# Patient Record
Sex: Female | Born: 1990 | Race: White | Hispanic: No | Marital: Single | Smoking: Current every day smoker
Health system: Southern US, Community
[De-identification: ages and names within clinical notes are randomized; demographics above are authoritative.]

## PROBLEM LIST (undated history)

## (undated) ENCOUNTER — Inpatient Hospital Stay (HOSPITAL_COMMUNITY): Payer: Self-pay

## (undated) DIAGNOSIS — Z8659 Personal history of other mental and behavioral disorders: Principal | ICD-10-CM

## (undated) DIAGNOSIS — F32A Depression, unspecified: Secondary | ICD-10-CM

## (undated) DIAGNOSIS — F419 Anxiety disorder, unspecified: Secondary | ICD-10-CM

## (undated) HISTORY — DX: Personal history of other mental and behavioral disorders: Z86.59

## (undated) HISTORY — PX: NO PAST SURGERIES: SHX2092

---
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
## 2012-06-07 ENCOUNTER — Encounter: Payer: Self-pay | Admitting: *Deleted

## 2012-06-30 ENCOUNTER — Other Ambulatory Visit: Payer: Self-pay | Admitting: *Deleted

## 2012-06-30 MED ORDER — AMPHETAMINE-DEXTROAMPHET ER 30 MG PO CP24: 30.0000 mg | ORAL_CAPSULE | ORAL | Status: DC

## 2012-06-30 MED ORDER — CETIRIZINE HCL 10 MG PO TABS: 10.0000 mg | ORAL_TABLET | Freq: Every day | ORAL | Status: DC

## 2012-07-05 ENCOUNTER — Ambulatory Visit: Payer: Self-pay | Admitting: Pediatrics

## 2012-10-06 ENCOUNTER — Ambulatory Visit (INDEPENDENT_AMBULATORY_CARE_PROVIDER_SITE_OTHER): Payer: Medicaid Other | Admitting: Pediatrics

## 2012-10-06 ENCOUNTER — Encounter: Payer: Self-pay | Admitting: Pediatrics

## 2012-10-06 VITALS — HR 100 | Temp 98.8°F | Wt 171.4 lb

## 2012-10-06 DIAGNOSIS — Z8659 Personal history of other mental and behavioral disorders: Secondary | ICD-10-CM

## 2012-10-06 HISTORY — DX: Personal history of other mental and behavioral disorders: Z86.59

## 2012-10-06 NOTE — Progress Notes (Signed)
Patient ID: Andrea Stephenson, female   DOB: [birth year], 22 y.o.   MRN: [id number]  Pt is here for ADHD f/u. Pt is on Adderall XR 30 mg. She has been on this dose for over a year. She was seen 4 m ago and was about to start [employer] classes. She had special forms filled for her ADHD. She ended up not going. She says she got scared and the couldn`t decide if it was what she really wanted to do. Has been doing well. Weight is up 15 lbs. The pt says she has been eating a lot because she snacks at odd hours. Both her sisters are expecting babies and she has had a lot to do and an unusual schedule. Generally the pt has had trouble keeping her weight up till now. She takes Adderall on most days. She skipped it for a few days last month and felt that she was very unfocused and forgetful. She also slept much more.She takes Clonidine for sleep on some nights. She says it is harder to sleep on the days she takes Adderall.  The pt was diagnosed at around 22 y/o. She thinks she was evaluated at a specialist. She has not been re-evaluated since that time. Records are not available prior to 2012. There is no h/o depression or other mood disorders.  ROS:  Apart from the symptoms reviewed above, there are no other symptoms referable to all systems reviewed. AR flaring up in the hot months. Takes Claritin almost daily.  Exam: Pulse 100, temperature 98.8 F (37.1 C), temperature source Temporal, weight 171 lb 6 oz (77.735 kg), last menstrual period 09/09/2012. General: alert, no distress, appropriate affect. Chest: CTA b/l CVS: RRR Neuro: intact.  No results found. No results found for this or any previous visit (from the past 240 hour(s)). No results found for this or any previous visit (from the past 48 hour(s)).  Assessment: ADHD: diagnosed about 15- 16 years ago. Insomnia issues: possibly 2ry to stimulant meds. AR: mild  Plan: Since the pt has been on meds for over 15 years, is not currently working or in school, has  not been re-evaluated, then it is reasonable to to try being off the meds for a while. She may have outgrown ADHD by now. I propose stopping the meds for now entirely and seeing how she does. She will try it for about 2 weeks and let us know how her sleep and focus are. If she still needs meds we may refer for evaluation or try a much lower dose. The pt is somewhat fearful but willing to try it. RTC in 4 m for Wellness check. Call with problems in the meantime. Let us know how she is doing off meds. Try Zyrtec in place of Claritin.

## 2012-12-09 ENCOUNTER — Ambulatory Visit: Payer: Medicaid Other | Admitting: Pediatrics

## 2013-02-09 ENCOUNTER — Ambulatory Visit: Payer: Medicaid Other | Admitting: Family Medicine

## 2014-06-26 ENCOUNTER — Emergency Department (HOSPITAL_COMMUNITY)
Admission: EM | Admit: 2014-06-26 | Discharge: 2014-06-26 | Disposition: A | Discharge status: Home / Self Care | Payer: Self-pay | Attending: Emergency Medicine | Admitting: Emergency Medicine

## 2014-06-26 ENCOUNTER — Encounter (HOSPITAL_COMMUNITY): Payer: Self-pay | Admitting: Emergency Medicine

## 2014-06-26 DIAGNOSIS — Z87891 Personal history of nicotine dependence: Secondary | ICD-10-CM | POA: Insufficient documentation

## 2014-06-26 DIAGNOSIS — N760 Acute vaginitis: Secondary | ICD-10-CM | POA: Insufficient documentation

## 2014-06-26 DIAGNOSIS — Z3202 Encounter for pregnancy test, result negative: Secondary | ICD-10-CM | POA: Insufficient documentation

## 2014-06-26 DIAGNOSIS — Z79899 Other long term (current) drug therapy: Secondary | ICD-10-CM | POA: Insufficient documentation

## 2014-06-26 DIAGNOSIS — N390 Urinary tract infection, site not specified: Secondary | ICD-10-CM | POA: Insufficient documentation

## 2014-06-26 DIAGNOSIS — F909 Attention-deficit hyperactivity disorder, unspecified type: Secondary | ICD-10-CM | POA: Insufficient documentation

## 2014-06-26 DIAGNOSIS — B9689 Other specified bacterial agents as the cause of diseases classified elsewhere: Secondary | ICD-10-CM

## 2014-06-26 LAB — URINALYSIS, ROUTINE W REFLEX MICROSCOPIC
Glucose, UA: 250 mg/dL — AB
Ketones, ur: 15 mg/dL — AB
Nitrite: POSITIVE — AB
Protein, ur: >300 mg/dL — AB
Specific Gravity, Urine: >1.03 — ABNORMAL HIGH (ref 1.005–1.030)
Urobilinogen, UA: >8 mg/dL — ABNORMAL HIGH (ref 0.0–1.0)
pH: 6.5 (ref 5.0–8.0)

## 2014-06-26 LAB — PREGNANCY, URINE: Preg Test, Ur: NEGATIVE

## 2014-06-26 LAB — WET PREP, GENITAL
Trich, Wet Prep: NONE SEEN
Yeast Wet Prep HPF POC: NONE SEEN

## 2014-06-26 LAB — URINE MICROSCOPIC-ADD ON

## 2014-06-26 MED ORDER — AZITHROMYCIN 250 MG PO TABS
1000.0000 mg | ORAL_TABLET | Freq: Once | ORAL | Status: AC
  Administered 2014-06-26: 1000 mg via ORAL
  Filled 2014-06-26: qty 4

## 2014-06-26 MED ORDER — STERILE WATER FOR INJECTION IJ SOLN
INTRAMUSCULAR | Status: AC
  Administered 2014-06-26: 10 mL
  Filled 2014-06-26: qty 10

## 2014-06-26 MED ORDER — IBUPROFEN 400 MG PO TABS
600.0000 mg | ORAL_TABLET | Freq: Once | ORAL | Status: AC
  Administered 2014-06-26: 600 mg via ORAL
  Filled 2014-06-26: qty 2

## 2014-06-26 MED ORDER — CEFTRIAXONE SODIUM 250 MG IJ SOLR
250.0000 mg | Freq: Once | INTRAMUSCULAR | Status: AC
  Administered 2014-06-26: 250 mg via INTRAMUSCULAR
  Filled 2014-06-26: qty 250

## 2014-06-26 MED ORDER — METRONIDAZOLE 500 MG PO TABS
2000.0000 mg | ORAL_TABLET | Freq: Once | ORAL | Status: AC
  Administered 2014-06-26: 2000 mg via ORAL
  Filled 2014-06-26: qty 4

## 2014-06-26 MED ORDER — CEPHALEXIN 500 MG PO CAPS: 500.0000 mg | ORAL_CAPSULE | Freq: Four times a day (QID) | ORAL | Status: DC

## 2014-06-26 MED ORDER — OXYCODONE-ACETAMINOPHEN 5-325 MG PO TABS
2.0000 | ORAL_TABLET | Freq: Once | ORAL | Status: AC
  Administered 2014-06-26: 2 via ORAL
  Filled 2014-06-26: qty 2

## 2014-06-26 NOTE — Discharge Instructions (Signed)
Bacterial Vaginosis Bacterial vaginosis is a vaginal infection that occurs when the normal balance of bacteria in the vagina is disrupted. It results from an overgrowth of certain bacteria. This is the most common vaginal infection in women of childbearing age. Treatment is important to prevent complications, especially in pregnant women, as it can cause a premature delivery. CAUSES  Bacterial vaginosis is caused by an increase in harmful bacteria that are normally present in smaller amounts in the vagina. Several different kinds of bacteria can cause bacterial vaginosis. However, the reason that the condition develops is not fully understood. RISK FACTORS Certain activities or behaviors can put you at an increased risk of developing bacterial vaginosis, including:  Having a new sex partner or multiple sex partners.  Douching.  Using an intrauterine device (IUD) for contraception. Women do not get bacterial vaginosis from toilet seats, bedding, swimming pools, or contact with objects around them. SIGNS AND SYMPTOMS  Some women with bacterial vaginosis have no signs or symptoms. Common symptoms include:  Grey vaginal discharge.  A fishlike odor with discharge, especially after sexual intercourse.  Itching or burning of the vagina and vulva.  Burning or pain with urination. DIAGNOSIS  Your health care provider will take a medical history and examine the vagina for signs of bacterial vaginosis. A sample of vaginal fluid may be taken. Your health care provider will look at this sample under a microscope to check for bacteria and abnormal cells. A vaginal pH test may also be done.  TREATMENT  Bacterial vaginosis may be treated with antibiotic medicines. These may be given in the form of a pill or a vaginal cream. A second round of antibiotics may be prescribed if the condition comes back after treatment.  HOME CARE INSTRUCTIONS   Only take over-the-counter or prescription medicines as  directed by your health care provider.  If antibiotic medicine was prescribed, take it as directed. Make sure you finish it even if you start to feel better.  Do not have sex until treatment is completed.  Tell all sexual partners that you have a vaginal infection. They should see their health care provider and be treated if they have problems, such as a mild rash or itching.  Practice safe sex by using condoms and only having one sex partner. SEEK MEDICAL CARE IF:   Your symptoms are not improving after 3 days of treatment.  You have increased discharge or pain.  You have a fever. MAKE SURE YOU:   Understand these instructions.  Will watch your condition.  Will get help right away if you are not doing well or get worse. FOR MORE INFORMATION  Centers for Disease Control and Prevention, Division of STD Prevention: AppraiserFraud.fi American Sexual Health Association (ASHA): www.ashastd.org  Document Released: 03/24/2005 Document Revised: 01/12/2013 Document Reviewed: 11/03/2012 [facility] Patient Information 2015 [place], [place]. This information is not intended to replace advice given to you by your health care provider. Make sure you discuss any questions you have with your health care provider.  Urinary Tract Infection Urinary tract infections (UTIs) can develop anywhere along your urinary tract. Your urinary tract is your body's drainage system for removing wastes and extra water. Your urinary tract includes two kidneys, two ureters, a bladder, and a urethra. Your kidneys are a pair of bean-shaped organs. Each kidney is about the size of your fist. They are located below your ribs, one on each side of your spine. CAUSES Infections are caused by microbes, which are microscopic organisms, including fungi, viruses,  and bacteria. These organisms are so small that they can only be seen through a microscope. Bacteria are the microbes that most commonly cause UTIs. SYMPTOMS  Symptoms of  UTIs may vary by age and gender of the patient and by the location of the infection. Symptoms in young women typically include a frequent and intense urge to urinate and a painful, burning feeling in the bladder or urethra during urination. Older women and men are more likely to be tired, shaky, and weak and have muscle aches and abdominal pain. A fever may mean the infection is in your kidneys. Other symptoms of a kidney infection include pain in your back or sides below the ribs, nausea, and vomiting. DIAGNOSIS To diagnose a UTI, your caregiver will ask you about your symptoms. Your caregiver also will ask to provide a urine sample. The urine sample will be tested for bacteria and white blood cells. White blood cells are made by your body to help fight infection. TREATMENT  Typically, UTIs can be treated with medication. Because most UTIs are caused by a bacterial infection, they usually can be treated with the use of antibiotics. The choice of antibiotic and length of treatment depend on your symptoms and the type of bacteria causing your infection. HOME CARE INSTRUCTIONS  If you were prescribed antibiotics, take them exactly as your caregiver instructs you. Finish the medication even if you feel better after you have only taken some of the medication.  Drink enough water and fluids to keep your urine clear or pale yellow.  Avoid caffeine, tea, and carbonated beverages. They tend to irritate your bladder.  Empty your bladder often. Avoid holding urine for long periods of time.  Empty your bladder before and after sexual intercourse.  After a bowel movement, women should cleanse from front to back. Use each tissue only once. SEEK MEDICAL CARE IF:   You have back pain.  You develop a fever.  Your symptoms do not begin to resolve within 3 days. SEEK IMMEDIATE MEDICAL CARE IF:   You have severe back pain or lower abdominal pain.  You develop chills.  You have nausea or vomiting.  You  have continued burning or discomfort with urination. MAKE SURE YOU:   Understand these instructions.  Will watch your condition.  Will get help right away if you are not doing well or get worse. Document Released: 01/01/2005 Document Revised: 09/23/2011 Document Reviewed: 05/02/2011 [facility] Patient Information 2015 [place], [place]. This information is not intended to replace advice given to you by your health care provider. Make sure you discuss any questions you have with your health care provider.   Emergency Department Resource Guide 1) Find a Doctor and Pay Out of Pocket Although you won't have to find out who is covered by your insurance plan, it is a good idea to ask around and get recommendations. You will then need to call the office and see if the doctor you have chosen will accept you as a new patient and what types of options they offer for patients who are self-pay. Some doctors offer discounts or will set up payment plans for their patients who do not have insurance, but you will need to ask so you aren't surprised when you get to your appointment.  2) Contact Your Local Health Department Not all health departments have doctors that can see patients for sick visits, but many do, so it is worth a call to see if yours does. If you don't know where your local  health department is, you can check in your phone book. The CDC also has a tool to help you locate your state's health department, and many state websites also have listings of all of their local health departments.  3) Find a Walk-in Clinic If your illness is not likely to be very severe or complicated, you may want to try a walk in clinic. These are popping up all over the country in pharmacies, drugstores, and shopping centers. They're usually staffed by nurse practitioners or physician assistants that have been trained to treat common illnesses and complaints. They're usually fairly quick and inexpensive. However, if you have  serious medical issues or chronic medical problems, these are probably not your best option.  No Primary Care Doctor: - Call Health Connect at  [phone] - they can help you locate a primary care doctor that  accepts your insurance, provides certain services, etc. - Physician Referral Service- [phone]  Chronic Pain Problems: Organization         Address  Phone   Notes  [facility]  [phone] Patients need to be referred by their primary care doctor.   Medication Assistance: Organization         Address  Phone   Notes  [facility] Medication [facility] [address] [place], [place] [address] [phone]) [phone] --Must be a resident of [facility] [place] -- Must have NO insurance coverage whatsoever (no Medicaid/ Medicare, etc.) -- The pt. MUST have a primary care doctor that directs their care regularly and follows them in the community   [facility]ist  [phone]) [phone]   [employer]  [phone]    Agencies that provide inexpensive medical care: Organization         Address  Phone   Notes  [facility]  [phone]) [phone]   [facility]    [phone]   [facility] [address] [place], [place] [address] [phone]) [phone]   [facility] [address], [place] [phone]   Planned Parenthood    [phone]   [facility]    [phone]   [facility]  [address], [place] Phone:  [phone], Fax:  [phone] Hours of Operation:  9 am - 6 pm, M-F.  Also accepts Medicaid/Medicare and self-pay.  [facility]  [address], [place] Phone: [phone]) [phone], Fax: [phone]. Hours of Operation:  8:30 am - 5:30 pm, M-F.  Also accepts Medicaid and self-pay.  [facility] [place] [address], [place] Phone: [phone]   [facility] [address],  [place], [place] [phone], Ext. [phone] Mondays & Thursdays: 7-9 AM.  First 15 patients are seen on a first come, first serve basis.    Medicaid-accepting [facility] Providers:  Organization         Address  Phone   Notes  [facility] [address], [place] [phone] Also accepts self-pay patients.  [facility] [address] [place], [place]  [phone]) [phone]   [facility] [address], [place] [phone]) [phone]   [facility] [address] [place] [address], [place] [phone]) [phone]   Renaye RakersVeita Bland [address], [place]   [phone]) [phone] Only accepts [place] Access [place] patients after they  have their name applied to their card.   Self-Pay (no insurance) in [facility]:  Organization         Address  Phone   Notes  Sickle Cell Patients, [facility] [address] [place], [place] [phone]   [facility] Urgent Care [address] [place], [place] [phone]   [facility] [place]  [address], [place] [phone]   [facility]/Dr. Osei-Bonsu  [address] [place] or [address], [place] [phone] Phone number for both [place] and [place] locations is the same.  Urgent Medical and [facility] [address] [place] [phone]   [facility] [address] [place] or [address] [phone]   [facility] [address], [place] [phone], phone; [phone], fax Sees patients 1st and 3rd Saturday of every month.  Must not qualify for public or private insurance (i.e. Medicaid, Medicare, [place] Health Choice, Veterans' Benefits)  Household income should be no more than 200% of the poverty level The clinic cannot treat you if you are pregnant or think you are pregnant   Sexually transmitted diseases are not treated at the clinic.    Dental Care: Organization         Address  Phone  Notes  [facility] Department of [facility] [address] [place], [place] [phone] Accepts children up to age 60 who are enrolled in [place] or [place] Health Choice; pregnant women with a Medicaid card; and children who have applied for Medicaid or [place] Health Choice, but were declined, whose parents can pay a reduced fee at time of service.  [facility] Department of [facility]  [address], [place] [phone] Accepts children up to age 74 who are enrolled in [place] or [place] Health Choice; pregnant women with a Medicaid card; and children who have applied for Medicaid or [place] Health Choice, but were declined, whose parents can pay a reduced fee at time of service.  [facility]  [address] [place], [place] [phone] Patients are seen by appointment only. Walk-ins are not accepted. [facility] will see patients 84 years of age and older. Monday - Tuesday (8am-5pm) Most Wednesdays (8:30-5pm) $30 per visit, cash only  [facility] Access PRO[facility]  [address], [facility] [phone] Patients are seen by appointment only. Walk-ins are not accepted. [facility] will see patients 31 years of age and older. One Wednesday Evening (Monthly: Volunteer Based).  $30 per visit, cash only  [employer]  [phone] for adults; Children under age 22, call Graduate Pediatric Dentistry at [phone]. Children aged 60-14, please call [phone] to request a pediatric application.  Dental services are provided in all areas of dental care including fillings, crowns and bridges, complete and partial dentures, implants, gum treatment, root canals, and extractions. Preventive care is also provided. Treatment is provided to both adults and children. Patients  are selected via a lottery and there is often a waiting list.   [facility] [address] [place]  [phone] [email or web address]   [facility] [address] [place], [place] [phone], Ext. [phone] Second and Fourth Thursday of each month, opens at 6:30 AM; Clinic ends at 9 AM.  Patients are seen  on a first-come first-served basis, and a limited number are seen during each clinic.   [facility]  [address] [place], [place] [phone]   Eligibility Requirements You must have lived in [place], [place], or [place] counties for at least the last three months.   You cannot be eligible for state or federal sponsored [place], including [employer], [place], or [employer].   You generally cannot be eligible for healthcare insurance through your employer.    How to apply: Eligibility screenings are held every Tuesday and Wednesday afternoon from 1:00 pm until 4:00 pm. You do not need an appointment for the interview!  [facility] [address] [place], [place] [phone]   [facility] Health Department  [phone]   [facility] Health [facility]  [phone]   [facility] Health Department  [phone]    Behavioral Health Resources in the Community: Intensive Outpatient Programs Organization         Address  Phone  Notes  [facility] Services [address] [place], [place] [phone]   [facility] Outpatient [address] [place], [place] [phone]   [facility]: [facility] [address], [place], [place]  [phone]   [facility] [address],  [place], [place] [phone] or [phone]   Substance Abuse Resources Organization         Address  Phone  Notes  Al[facility] Drug Services  [phone]   [facility]  [phone]   [facility] [facility]  [phone]   Floydene Flock  [phone]    Residential & Outpatient Substance Abuse Program  [phone]   Psychological Services Organization         Address  Phone  Notes  [facility] Health  [phone]   [facility] Services  [phone]   [facility] [address] [place] [phone] or [phone]    Mobile Crisis Teams Organization         Address  Phone  Notes  [facility], Mobile Crisis Care Unit  [phone]   [facility]  [address] [place], [place] [phone]   Doristine Locks [address] [place] [phone]    Self-Help/Support Groups Organization         Address  Phone             Notes  [facility] [place] - variety of support groups  [phone]- I7437963 Call for more information  Narcotics Anonymous (NA), Caring [facility] [address], [employer] [place]  2 meetings at this location   Statistician         Address  Phone  Notes  [facility] [address],    [place]  [phone]   [facility]  [address], [place] [address], [place], [place] [phone]   [facility] [address] [place], [place] [phone] Admissions: 8am-3pm M-F  [facility] [address],    [place], [place] [phone]   [facility] [address] [place], [place] [phone]   [facility] [address]  [place], [place] [phone]   [facility] - Intensive Outpatient [address], [place], [place] [phone]   [facility] (Addiction Recovery [facility] [address]  [place], [place] [phone] or [phone]   [facility] ([facility]) [address] [place], [place]  [phone] Accepts Medicaid  Fellowship [address],  [place] [phone] Substance Abuse/Addiction Treatment   [facility] Organization         Address  Phone  Notes  [facility]  [phone]   Angie Fava, PhD [address] [place], [place]   [phone] or [phone]   [facility] Behavioral   [address] [place], [place] [phone]   [facility] [address] [place], [place] [phone] Insurance/Medicaid/sponsorship through [facility] and Families [address]                                    [place], [place] [phone] Therapy/tele-psych/case  [facility] [address] [place], [place] [phone]    Dr. Lolly Mustache  [phone]   [facility]  [employer] [facility] Dept. 1) [address] [place] 2) [address] [place] 3)  [address], [place] [phone]  [phone]   [facility] Child Abuse Hotline 234 531 2765 or [phone] (After Hours)

## 2014-06-26 NOTE — ED Notes (Signed)
Pt reports lower pelvic pain x2 days. Pt reports vaginal discharge. LMP now. Pt denies any abdominal pain,n/v.

## 2014-06-27 NOTE — ED Provider Notes (Signed)
CSN: [id number]     Arrival date & time 06/26/14  1343 History   First MD Initiated Contact with Patient 06/26/14 1823     Chief Complaint  Patient presents with  . Pelvic Pain     (Consider location/radiation/quality/duration/timing/severity/associated sxs/prior Treatment) HPI   23yf with lower abdominal/pelvic pain. Gradual onset about 2 days ago. Associated with whitish vaginal discharge. Currently menstruating. No nausea vomiting. No specific urinary complaints. No diarrhea. No appreciable exacerbating relieving factors.  Past Medical History  Diagnosis Date  . History of ADHD 10/06/2012   History reviewed. No pertinent past surgical history. History reviewed. No pertinent family history. History  Substance Use Topics  . Smoking status: Former Smoker -- 0.50 packs/day    Types: Cigarettes  . Smokeless tobacco: Not on file  . Alcohol Use: No   OB History    No data available     Review of Systems  All systems reviewed and negative, other than as noted in HPI.   Allergies  Review of patient's allergies indicates no known allergies.  Home Medications   Prior to Admission medications   Medication Sig Start Date End Date Taking? Authorizing Provider  Cranberry-Vitamin C-Probiotic (AZO CRANBERRY) 250-30 MG TABS Take 1-2 tablets by mouth daily as needed (FOR URINARY PAIN RELIEF).   Yes Historical Provider, MD  Phenazopyridine HCl (AZO TABS PO) Take 2-3 tablets by mouth daily as needed (FOR URINARY RELIEF).   Yes Historical Provider, MD  amphetamine-dextroamphetamine (ADDERALL XR) 30 MG 24 hr capsule Take 1 capsule (30 mg total) by mouth every morning. Patient not taking: Reported on 06/26/2014 06/30/12   Laurell Josephs, MD  cephALEXin (KEFLEX) 500 MG capsule Take 1 capsule (500 mg total) by mouth 4 (four) times daily. 06/26/14   Raeford Razor, MD  cetirizine (ZYRTEC) 10 MG tablet Take 1 tablet (10 mg total) by mouth daily. Patient not taking: Reported on 06/26/2014 06/30/12    Laurell Josephs, MD   BP 124/75 mmHg  Pulse 94  Temp(Src) 98.7 F (37.1 C) (Oral)  Resp 20  Ht  (1.6 m)  Wt 192 lb 1.6 oz (87.136 kg)  BMI 34.04 kg/m2  SpO2 97%  LMP 06/26/2014 Physical Exam  Constitutional: She appears well-developed and well-nourished. No distress.  HENT:  Head: Normocephalic and atraumatic.  Eyes: Conjunctivae are normal. Right eye exhibits no discharge. Left eye exhibits no discharge.  Neck: Neck supple.  Cardiovascular: Normal rate, regular rhythm and normal heart sounds.  Exam reveals no gallop and no friction rub.   No murmur heard. Pulmonary/Chest: Effort normal and breath sounds normal. No respiratory distress.  Abdominal: Soft. She exhibits no distension. There is no tenderness.  Genitourinary:  Chaperone present. Normal external female genitalia. Scant whitish vaginal discharge. Cervix normal in appearance. Closed. No cervical motion tenderness, adnexal mass or adnexal tenderness.  Musculoskeletal: She exhibits no edema or tenderness.  Neurological: She is alert.  Skin: Skin is warm and dry.  Psychiatric: She has a normal mood and affect. Her behavior is normal. Thought content normal.  Nursing note and vitals reviewed.   ED Course  Procedures (including critical care time) Labs Review Labs Reviewed  WET PREP, GENITAL - Abnormal; Notable for the following:    Clue Cells Wet Prep HPF POC FEW (*)    WBC, Wet Prep HPF POC FEW (*)    All other components within normal limits  URINALYSIS, ROUTINE W REFLEX MICROSCOPIC - Abnormal; Notable for the following:    Color, Urine ORANGE (*)  APPearance CLOUDY (*)    Specific Gravity, Urine >1.030 (*)    Glucose, UA 250 (*)    Hgb urine dipstick LARGE (*)    Bilirubin Urine MODERATE (*)    Ketones, ur 15 (*)    Protein, ur >300 (*)    Urobilinogen, UA >8.0 (*)    Nitrite POSITIVE (*)    Leukocytes, UA MODERATE (*)    All other components within normal limits  URINE MICROSCOPIC-ADD ON - Abnormal;  Notable for the following:    Squamous Epithelial / LPF MANY (*)    Bacteria, UA FEW (*)    All other components within normal limits  URINE CULTURE  PREGNANCY, URINE  GC/CHLAMYDIA PROBE AMP ([place])    Imaging Review No results found.   EKG Interpretation None      MDM   Final diagnoses:  UTI (lower urinary tract infection)  Bacterial vaginosis        Raeford RazorStephen Jailynne Opperman, MD 06/30/14 1946

## 2014-06-28 LAB — URINE CULTURE: Colony Count: >=100000

## 2014-06-28 LAB — GC/CHLAMYDIA PROBE AMP (~~LOC~~) NOT AT ARMC
Chlamydia: NEGATIVE
Neisseria Gonorrhea: NEGATIVE

## 2014-06-29 ENCOUNTER — Telehealth (HOSPITAL_BASED_OUTPATIENT_CLINIC_OR_DEPARTMENT_OTHER): Payer: Self-pay | Admitting: Emergency Medicine

## 2014-06-29 NOTE — Telephone Encounter (Signed)
Post ED Visit - Positive Culture Follow-up  Culture report reviewed by antimicrobial stewardship pharmacist: []  Wes Dulaney, Pharm.D., BCPS [x]  Celedonio MiyamotoJeremy Frens, Pharm.D., BCPS []  Georgina PillionElizabeth Martin, Pharm.D., BCPS []  PleasurevilleMinh Pham, [address]Ph[address]D., BCPS, AAHIVP []  Estella HuskMichelle Turner, Pharm.D., BCPS, AAHIVP []  Elder CyphersLorie Poole, [address]Pharm[address]D., BCPS  Positive urine culture Group A strep Treated with cephalexin, organism sensitive to the same and no further patient follow-up is required at this time.  Berle MullMiller, Teion Ballin 06/29/2014, 9:27 AM

## 2014-08-20 ENCOUNTER — Other Ambulatory Visit (HOSPITAL_COMMUNITY): Payer: Self-pay

## 2014-08-20 ENCOUNTER — Encounter (HOSPITAL_COMMUNITY): Payer: Self-pay | Admitting: Emergency Medicine

## 2014-08-20 ENCOUNTER — Emergency Department (HOSPITAL_COMMUNITY)
Admission: EM | Admit: 2014-08-20 | Discharge: 2014-08-20 | Disposition: A | Discharge status: Home / Self Care | Payer: Self-pay | Attending: Emergency Medicine | Admitting: Emergency Medicine

## 2014-08-20 ENCOUNTER — Ambulatory Visit (HOSPITAL_COMMUNITY)
Admission: RE | Admit: 2014-08-20 | Discharge: 2014-08-20 | Disposition: A | Discharge status: Home / Self Care | Payer: Self-pay | Source: Ambulatory Visit | Attending: Emergency Medicine | Admitting: Emergency Medicine

## 2014-08-20 ENCOUNTER — Other Ambulatory Visit (HOSPITAL_COMMUNITY): Payer: Self-pay | Admitting: Emergency Medicine

## 2014-08-20 DIAGNOSIS — Z3A09 9 weeks gestation of pregnancy: Secondary | ICD-10-CM | POA: Insufficient documentation

## 2014-08-20 DIAGNOSIS — Z87891 Personal history of nicotine dependence: Secondary | ICD-10-CM | POA: Insufficient documentation

## 2014-08-20 DIAGNOSIS — O26891 Other specified pregnancy related conditions, first trimester: Secondary | ICD-10-CM | POA: Insufficient documentation

## 2014-08-20 DIAGNOSIS — Y9289 Other specified places as the place of occurrence of the external cause: Secondary | ICD-10-CM | POA: Insufficient documentation

## 2014-08-20 DIAGNOSIS — O99341 Other mental disorders complicating pregnancy, first trimester: Secondary | ICD-10-CM | POA: Insufficient documentation

## 2014-08-20 DIAGNOSIS — Z792 Long term (current) use of antibiotics: Secondary | ICD-10-CM | POA: Insufficient documentation

## 2014-08-20 DIAGNOSIS — Z3A01 Less than 8 weeks gestation of pregnancy: Secondary | ICD-10-CM | POA: Insufficient documentation

## 2014-08-20 DIAGNOSIS — R52 Pain, unspecified: Secondary | ICD-10-CM

## 2014-08-20 DIAGNOSIS — O9A211 Injury, poisoning and certain other consequences of external causes complicating pregnancy, first trimester: Secondary | ICD-10-CM | POA: Insufficient documentation

## 2014-08-20 DIAGNOSIS — Y998 Other external cause status: Secondary | ICD-10-CM | POA: Insufficient documentation

## 2014-08-20 DIAGNOSIS — Z3490 Encounter for supervision of normal pregnancy, unspecified, unspecified trimester: Secondary | ICD-10-CM

## 2014-08-20 DIAGNOSIS — F909 Attention-deficit hyperactivity disorder, unspecified type: Secondary | ICD-10-CM | POA: Insufficient documentation

## 2014-08-20 DIAGNOSIS — R102 Pelvic and perineal pain: Secondary | ICD-10-CM | POA: Insufficient documentation

## 2014-08-20 DIAGNOSIS — Y9389 Activity, other specified: Secondary | ICD-10-CM | POA: Insufficient documentation

## 2014-08-20 DIAGNOSIS — Z79899 Other long term (current) drug therapy: Secondary | ICD-10-CM | POA: Insufficient documentation

## 2014-08-20 DIAGNOSIS — S301XXA Contusion of abdominal wall, initial encounter: Secondary | ICD-10-CM | POA: Insufficient documentation

## 2014-08-20 LAB — URINALYSIS, ROUTINE W REFLEX MICROSCOPIC
Bilirubin Urine: NEGATIVE
Glucose, UA: NEGATIVE mg/dL
Hgb urine dipstick: NEGATIVE
KETONES UR: NEGATIVE mg/dL
Leukocytes, UA: NEGATIVE
NITRITE: NEGATIVE
PH: 6 (ref 5.0–8.0)
Protein, ur: NEGATIVE mg/dL
Specific Gravity, Urine: 1.02 (ref 1.005–1.030)
Urobilinogen, UA: 0.2 mg/dL (ref 0.0–1.0)

## 2014-08-20 MED ORDER — ACETAMINOPHEN 325 MG PO TABS
650.0000 mg | ORAL_TABLET | Freq: Once | ORAL | Status: AC
  Administered 2014-08-20: 650 mg via ORAL
  Filled 2014-08-20: qty 2
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
## 2014-08-20 NOTE — ED Notes (Signed)
Discharge instructions given, pt demonstrated teach back and verbal understanding. No concerns voiced.  

## 2014-08-20 NOTE — Discharge Instructions (Signed)
You can take acetaminophen 650 mg every 6 hrs for pain if needed. Ice packs to the sore area. Return later this morning for an 11 am ultrasound to look at your pregnancy. Come 15 minutes early and register as an outpatient in ED registration. You will be given the results of your ultrasound by the ED doctor and given any new instructions at that time. Keep your prenatal appointment with Dr Emelda FearFerguson on the 18th, unless told to see him sooner. Return to the ED if you get vaginal bleeding.    Contusion A contusion is a deep bruise. Contusions happen when an injury causes bleeding under the skin. Signs of bruising include pain, puffiness (swelling), and discolored skin. The contusion may turn blue, purple, or yellow. HOME CARE   Put ice on the injured area.  Put ice in a plastic bag.  Place a towel between your skin and the bag.  Leave the ice on for 15-20 minutes, 03-04 times a day.  Only take medicine as told by your doctor.  Rest the injured area.  If possible, raise (elevate) the injured area to lessen puffiness. GET HELP RIGHT AWAY IF:   You have more bruising or puffiness.  You have pain that is getting worse.  Your puffiness or pain is not helped by medicine. MAKE SURE YOU:   Understand these instructions.  Will watch your condition.  Will get help right away if you are not doing well or get worse. Document Released: 09/10/2007 Document Revised: 06/16/2011 Document Reviewed: 01/27/2011 [facility] Patient Information 2015 [place], [place]. This information is not intended to replace advice given to you by your health care provider. Make sure you discuss any questions you have with your health care provider.     First Trimester of Pregnancy The first trimester of pregnancy is from week 1 until the end of week 12 (months 1 through 3). During this time, your baby will begin to develop inside you. At 6-8 weeks, the eyes and face are formed, and the heartbeat can be seen on  ultrasound. At the end of 12 weeks, all the baby's organs are formed. Prenatal care is all the medical care you receive before the birth of your baby. Make sure you get good prenatal care and follow all of your doctor's instructions. HOME CARE  Medicines  Take medicine only as told by your doctor. Some medicines are safe and some are not during pregnancy.  Take your prenatal vitamins as told by your doctor.  Take medicine that helps you poop (stool softener) as needed if your doctor says it is okay. Diet  Eat regular, healthy meals.  Your doctor will tell you the amount of weight gain that is right for you.  Avoid raw meat and uncooked cheese.  If you feel sick to your stomach (nauseous) or throw up (vomit):  Eat 4 or 5 small meals a day instead of 3 large meals.  Try eating a few soda crackers.  Drink liquids between meals instead of during meals.  If you have a hard time pooping (constipation):  Eat high-fiber foods like fresh vegetables, fruit, and whole grains.  Drink enough fluids to keep your pee (urine) clear or pale yellow. Activity and Exercise  Exercise only as told by your doctor. Stop exercising if you have cramps or pain in your lower belly (abdomen) or low back.  Try to avoid standing for long periods of time. Move your legs often if you must stand in one place for a long  time.  Avoid heavy lifting.  Wear low-heeled shoes. Sit and stand up straight.  You can have sex unless your doctor tells you not to. Relief of Pain or Discomfort  Wear a good support bra if your breasts are sore.  Take warm water baths (sitz baths) to soothe pain or discomfort caused by hemorrhoids. Use hemorrhoid cream if your doctor says it is okay.  Rest with your legs raised if you have leg cramps or low back pain.  Wear support hose if you have puffy, bulging veins (varicose veins) in your legs. Raise (elevate) your feet for 15 minutes, 3-4 times a day. Limit salt in your  diet. Prenatal Care  Schedule your prenatal visits by the twelfth week of pregnancy.  Write down your questions. Take them to your prenatal visits.  Keep all your prenatal visits as told by your doctor. Safety  Wear your seat belt at all times when driving.  Make a list of emergency phone numbers. The list should include numbers for family, friends, the hospital, and police and fire departments. General Tips  Ask your doctor for a referral to a local prenatal class. Begin classes no later than at the start of month 6 of your pregnancy.  Ask for help if you need counseling or help with nutrition. Your doctor can give you advice or tell you where to go for help.  Do not use hot tubs, steam rooms, or saunas.  Do not douche or use tampons or scented sanitary pads.  Do not cross your legs for long periods of time.  Avoid litter boxes and soil used by cats.  Avoid all smoking, herbs, and alcohol. Avoid drugs not approved by your doctor.  Visit your dentist. At home, brush your teeth with a soft toothbrush. Be gentle when you floss. GET HELP IF:  You are dizzy.  You have mild cramps or pressure in your lower belly.  You have a nagging pain in your belly area.  You continue to feel sick to your stomach, throw up, or have watery poop (diarrhea).  You have a bad smelling fluid coming from your vagina.  You have pain with peeing (urination).  You have increased puffiness (swelling) in your face, hands, legs, or ankles. GET HELP RIGHT AWAY IF:   You have a fever.  You are leaking fluid from your vagina.  You have spotting or bleeding from your vagina.  You have very bad belly cramping or pain.  You gain or lose weight rapidly.  You throw up blood. It may look like coffee grounds.  You are around people who have [place] measles, fifth disease, or chickenpox.  You have a very bad headache.  You have shortness of breath.  You have any kind of trauma, such as from a  fall or a car accident. Document Released: 09/10/2007 Document Revised: 08/08/2013 Document Reviewed: 02/01/2013 [facility] Patient Information 2015 [place]unctionExitCare, [place]LL[place]. This information is not intended to replace advice given to you by your health care provider. Make sure you discuss any questions you have with your health care provider.

## 2014-08-20 NOTE — ED Notes (Signed)
Assessed FHT very low in abdomen at left side of pubic/pelvis area. Questionable if tones were actual FHT. Registered 149 on doppler as compared to 79 with mothers heart rate on monitor for comparison.
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
## 2014-08-20 NOTE — ED Notes (Signed)
Pt states she had confirmation pregnancy test last week [facility]. She is [redacted] weeks pregnant.

## 2014-08-21 ENCOUNTER — Other Ambulatory Visit: Payer: Self-pay | Admitting: Obstetrics & Gynecology

## 2014-08-21 DIAGNOSIS — O3680X1 Pregnancy with inconclusive fetal viability, fetus 1: Secondary | ICD-10-CM

## 2014-08-23 ENCOUNTER — Ambulatory Visit (INDEPENDENT_AMBULATORY_CARE_PROVIDER_SITE_OTHER): Payer: Medicaid Other

## 2014-08-23 DIAGNOSIS — O3680X1 Pregnancy with inconclusive fetal viability, fetus 1: Secondary | ICD-10-CM

## 2014-08-23 NOTE — Progress Notes (Signed)
US 3046w4d single IUP w/ys, pos fht 142bpm,normal ov's bilat, 2.0 x .5 x 2.4 cm subchorionic hemorrhage

## 2014-08-30 ENCOUNTER — Other Ambulatory Visit (HOSPITAL_COMMUNITY)
Admission: RE | Admit: 2014-08-30 | Discharge: 2014-08-30 | Disposition: A | Discharge status: Home / Self Care | Payer: Self-pay | Source: Ambulatory Visit | Attending: Advanced Practice Midwife | Admitting: Advanced Practice Midwife

## 2014-08-30 ENCOUNTER — Ambulatory Visit (INDEPENDENT_AMBULATORY_CARE_PROVIDER_SITE_OTHER): Payer: Medicaid Other | Admitting: Advanced Practice Midwife

## 2014-08-30 VITALS — BP 120/76 | HR 80 | Wt 183.0 lb

## 2014-08-30 DIAGNOSIS — Z331 Pregnant state, incidental: Secondary | ICD-10-CM

## 2014-08-30 DIAGNOSIS — Z720 Tobacco use: Secondary | ICD-10-CM

## 2014-08-30 DIAGNOSIS — Z3401 Encounter for supervision of normal first pregnancy, first trimester: Secondary | ICD-10-CM

## 2014-08-30 DIAGNOSIS — Z369 Encounter for antenatal screening, unspecified: Secondary | ICD-10-CM

## 2014-08-30 DIAGNOSIS — Z1389 Encounter for screening for other disorder: Secondary | ICD-10-CM

## 2014-08-30 DIAGNOSIS — Z3682 Encounter for antenatal screening for nuchal translucency: Secondary | ICD-10-CM

## 2014-08-30 DIAGNOSIS — Z01419 Encounter for gynecological examination (general) (routine) without abnormal findings: Secondary | ICD-10-CM | POA: Insufficient documentation

## 2014-08-30 DIAGNOSIS — Z113 Encounter for screening for infections with a predominantly sexual mode of transmission: Secondary | ICD-10-CM | POA: Insufficient documentation

## 2014-08-30 DIAGNOSIS — Z3400 Encounter for supervision of normal first pregnancy, unspecified trimester: Secondary | ICD-10-CM | POA: Insufficient documentation

## 2014-08-30 LAB — POCT URINALYSIS DIPSTICK
Glucose, UA: NEGATIVE
Ketones, UA: NEGATIVE
Leukocytes, UA: NEGATIVE
NITRITE UA: NEGATIVE
Protein, UA: NEGATIVE
RBC UA: NEGATIVE

## 2014-08-30 NOTE — Progress Notes (Signed)
  Subjective:    Andrea Stephenson is a G1P0 1370w4d being seen today for her first obstetrical visit.  Her obstetrical history is significant for smoker., ~ 1/2 ppd.  Pregnancy history fully reviewed.  Patient reports no complaints.  Filed Vitals:   08/30/14 1515  BP: 120/76  Pulse: 80  Weight: 183 lb (83.008 kg)    HISTORY: OB History  Gravida Para Term Preterm AB SAB TAB Ectopic Multiple Living  1             # Outcome Date GA Lbr Len/2nd Weight Sex Delivery Anes PTL Lv  1 Current              Past Medical History  Diagnosis Date  . History of ADHD 10/06/2012   No past surgical history on file. No family history on file.   Exam       Pelvic Exam:    Perineum: Normal Perineum   Vulva: normal   Vagina:  normal mucosa, normal discharge, no palpable nodules   Uterus Normal, Gravid, FH: 8     Cervix: normal   Adnexa: Not palpable   Urinary:  urethral meatus normal    System: Breast:  normal appearance, no masses or tenderness   Skin: normal coloration and turgor, no rashes    Neurologic: oriented, normal, normal mood   Extremities: normal strength, tone, and muscle mass   HEENT PERRLA   Mouth/Teeth mucous membranes moist, normal dentition   Neck supple and no masses   Cardiovascular: regular rate and rhythm   Respiratory:  appears well, vitals normal, no respiratory distress, acyanotic   Abdomen: soft, non-tender;  FHR: 160us          Assessment:    Pregnancy: G1P0 Patient Active Problem List   Diagnosis Date Noted  . Supervision of normal first pregnancy 08/30/2014  . History of ADHD 10/06/2012        Plan:     Initial labs drawn. Continue prenatal vitamins  Problem list reviewed and updated  Strategies to decrease/stop smoking discussed, including patch/gum/e cigs Reviewed n/v relief measures and warning s/s to report  Reviewed recommended weight gain based on pre-gravid BMI  Encouraged well-balanced diet Genetic Screening discussed Integrated  Screen: requested.  Ultrasound discussed; fetal survey: requested.  Follow up in 4 weeks for NT/ITLROB.  CRESENZO-DISHMAN,Andrea Stephenson 08/30/2014

## 2014-08-30 NOTE — Progress Notes (Signed)
Pt given CCNC form and lab consents to read over and sign. Pt denies any problems or concerns at this time.  

## 2014-08-30 NOTE — Patient Instructions (Addendum)
Pregnancy and Smoking Smoking during pregnancy is unhealthy for you and your developing baby. The addictive drug nicotine, carbon monoxide, and many other poisons are inhaled from a cigarette and carried through your bloodstream to your baby. Cigarette smoke contains more than 2,500 chemicals. Both nicotine and carbon monoxide play a role in causing health problems in pregnancy. Smoking during pregnancy increases the risk of:  Birth defects in your baby, including heart defects.  Miscarriage and stillbirth.  Birth before 37 completed weeks of pregnancy (premature birth).  Pregnancy outside of the uterus (tubal pregnancy).  Attachment of the placenta over the opening of the uterus (placenta previa).  Detachment of the placenta before the baby's birth (placental abruption).  Breaking of the bag of waters before labor begins (premature rupture of membranes). HOW DOES SMOKING DURING PREGNANCY AFFECT MY BABY? Before Birth Smoking during pregnancy:  Decreases blood flow and oxygen to your baby.  Increases the heart rate of your baby.  Slows your baby's growth in the uterus (intrauterine growth retardation). After Birth Babies born to women who smoke during pregnancy are more likely to have a low birth weight. They are also at risk for:  Serious health problems, chronic or lifelong disabilities (cerebral palsy, mental retardation, learning problems), and death.  Sudden infant death syndrome (SIDS).  Lung and breathing problems. WHAT RESOURCES ARE AVAILABLE TO HELP ME STOP SMOKING?  Ask your health care provider for help to stop smoking. The following resources are available:  Counseling.  Psychological treatment.  Acupuncture.  Family intervention.  Hypnosis.  Nicotine supplements have not been studied enough to know if they are safe to use during pregnancy. They should only be considered when all other methods fail, and if used under the close supervision of your health care  provider.  Telephone QUIT lines. The national smoking cessation telephone hotline number is 1-800-QUIT NOW. FOR MORE INFORMATION  American Cancer Society: www.cancer.org  American Heart Association: www.heart.org  National Cancer Institute: www.cancer.gov  March of Dimes: www.marchofdimes.org Document Released: 08/05/2004 Document Revised: 03/29/2013 Document Reviewed: 02/21/2013 [facility] Patient Information 2015 [place], [place]. This information is not intended to replace advice given to you by your health care provider. Make sure you discuss any questions you have with your health care provider. First Trimester of Pregnancy The first trimester of pregnancy is from week 1 until the end of week 12 (months 1 through 3). A week after a sperm fertilizes an egg, the egg will implant on the wall of the uterus. This embryo will begin to develop into a baby. Genes from you and your partner are forming the baby. The female genes determine whether the baby is a boy or a girl. At 6-8 weeks, the eyes and face are formed, and the heartbeat can be seen on ultrasound. At the end of 12 weeks, all the baby's organs are formed.  Now that you are pregnant, you will want to do everything you can to have a healthy baby. Two of the most important things are to get good prenatal care and to follow your health care provider's instructions. Prenatal care is all the medical care you receive before the baby's birth. This care will help prevent, find, and treat any problems during the pregnancy and childbirth. BODY CHANGES Your body goes through many changes during pregnancy. The changes vary from woman to woman.   You may gain or lose a couple of pounds at first.  You may feel sick to your stomach (nauseous) and throw up (vomit). If the  vomiting is uncontrollable, call your health care provider.  You may tire easily.  You may develop headaches that can be relieved by medicines approved by your health care  provider.  You may urinate more often. Painful urination may mean you have a bladder infection.  You may develop heartburn as a result of your pregnancy.  You may develop constipation because certain hormones are causing the muscles that push waste through your intestines to slow down.  You may develop hemorrhoids or swollen, bulging veins (varicose veins).  Your breasts may begin to grow larger and become tender. Your nipples may stick out more, and the tissue that surrounds them (areola) may become darker.  Your gums may bleed and may be sensitive to brushing and flossing.  Dark spots or blotches (chloasma, mask of pregnancy) may develop on your face. This will likely fade after the baby is born.  Your menstrual periods will stop.  You may have a loss of appetite.  You may develop cravings for certain kinds of food.  You may have changes in your emotions from day to day, such as being excited to be pregnant or being concerned that something may go wrong with the pregnancy and baby.  You may have more vivid and strange dreams.  You may have changes in your hair. These can include thickening of your hair, rapid growth, and changes in texture. Some women also have hair loss during or after pregnancy, or hair that feels dry or thin. Your hair will most likely return to normal after your baby is born. WHAT TO EXPECT AT YOUR PRENATAL VISITS During a routine prenatal visit:  You will be weighed to make sure you and the baby are growing normally.  Your blood pressure will be taken.  Your abdomen will be measured to track your baby's growth.  The fetal heartbeat will be listened to starting around week 10 or 12 of your pregnancy.  Test results from any previous visits will be discussed. Your health care provider may ask you:  How you are feeling.  If you are feeling the baby move.  If you have had any abnormal symptoms, such as leaking fluid, bleeding, severe headaches, or  abdominal cramping.  If you have any questions. Other tests that may be performed during your first trimester include:  Blood tests to find your blood type and to check for the presence of any previous infections. They will also be used to check for low iron levels (anemia) and Rh antibodies. Later in the pregnancy, blood tests for diabetes will be done along with other tests if problems develop.  Urine tests to check for infections, diabetes, or protein in the urine.  An ultrasound to confirm the proper growth and development of the baby.  An amniocentesis to check for possible genetic problems.  Fetal screens for spina bifida and Down syndrome.  You may need other tests to make sure you and the baby are doing well. HOME CARE INSTRUCTIONS  Medicines  Follow your health care provider's instructions regarding medicine use. Specific medicines may be either safe or unsafe to take during pregnancy.  Take your prenatal vitamins as directed.  If you develop constipation, try taking a stool softener if your health care provider approves. Diet  Eat regular, well-balanced meals. Choose a variety of foods, such as meat or vegetable-based protein, fish, milk and low-fat dairy products, vegetables, fruits, and whole grain breads and cereals. Your health care provider will help you determine the amount of weight  gain that is right for you.  Avoid raw meat and uncooked cheese. These carry germs that can cause birth defects in the baby.  Eating four or five small meals rather than three large meals a day may help relieve nausea and vomiting. If you start to feel nauseous, eating a few soda crackers can be helpful. Drinking liquids between meals instead of during meals also seems to help nausea and vomiting.  If you develop constipation, eat more high-fiber foods, such as fresh vegetables or fruit and whole grains. Drink enough fluids to keep your urine clear or pale yellow. Activity and  Exercise  Exercise only as directed by your health care provider. Exercising will help you:  Control your weight.  Stay in shape.  Be prepared for labor and delivery.  Experiencing pain or cramping in the lower abdomen or low back is a good sign that you should stop exercising. Check with your health care provider before continuing normal exercises.  Try to avoid standing for long periods of time. Move your legs often if you must stand in one place for a long time.  Avoid heavy lifting.  Wear low-heeled shoes, and practice good posture.  You may continue to have sex unless your health care provider directs you otherwise. Relief of Pain or Discomfort  Wear a good support bra for breast tenderness.   Take warm sitz baths to soothe any pain or discomfort caused by hemorrhoids. Use hemorrhoid cream if your health care provider approves.   Rest with your legs elevated if you have leg cramps or low back pain.  If you develop varicose veins in your legs, wear support hose. Elevate your feet for 15 minutes, 3-4 times a day. Limit salt in your diet. Prenatal Care  Schedule your prenatal visits by the twelfth week of pregnancy. They are usually scheduled monthly at first, then more often in the last 2 months before delivery.  Write down your questions. Take them to your prenatal visits.  Keep all your prenatal visits as directed by your health care provider. Safety  Wear your seat belt at all times when driving.  Make a list of emergency phone numbers, including numbers for family, friends, the hospital, and police and fire departments. General Tips  Ask your health care provider for a referral to a local prenatal education class. Begin classes no later than at the beginning of month 6 of your pregnancy.  Ask for help if you have counseling or nutritional needs during pregnancy. Your health care provider can offer advice or refer you to specialists for help with various  needs.  Do not use hot tubs, steam rooms, or saunas.  Do not douche or use tampons or scented sanitary pads.  Do not cross your legs for long periods of time.  Avoid cat litter boxes and soil used by cats. These carry germs that can cause birth defects in the baby and possibly loss of the fetus by miscarriage or stillbirth.  Avoid all smoking, herbs, alcohol, and medicines not prescribed by your health care provider. Chemicals in these affect the formation and growth of the baby.  Schedule a dentist appointment. At home, brush your teeth with a soft toothbrush and be gentle when you floss. SEEK MEDICAL CARE IF:   You have dizziness.  You have mild pelvic cramps, pelvic pressure, or nagging pain in the abdominal area.  You have persistent nausea, vomiting, or diarrhea.  You have a bad smelling vaginal discharge.  You have pain with  urination.  You notice increased swelling in your face, hands, legs, or ankles. SEEK IMMEDIATE MEDICAL CARE IF:   You have a fever.  You are leaking fluid from your vagina.  You have spotting or bleeding from your vagina.  You have severe abdominal cramping or pain.  You have rapid weight gain or loss.  You vomit blood or material that looks like coffee grounds.  You are exposed to [place] measles and have never had them.  You are exposed to fifth disease or chickenpox.  You develop a severe headache.  You have shortness of breath.  You have any kind of trauma, such as from a fall or a car accident. Document Released: 03/18/2001 Document Revised: 08/08/2013 Document Reviewed: 02/01/2013 [facility] Patient Information 2015 [place], [place]. This information is not intended to replace advice given to you by your health care provider. Make sure you discuss any questions you have with your health care provider.

## 2014-09-01 LAB — URINE CULTURE

## 2014-09-02 MED ORDER — NITROFURANTOIN MONOHYD MACRO 100 MG PO CAPS: 100.0000 mg | ORAL_CAPSULE | Freq: Two times a day (BID) | ORAL | Status: AC

## 2014-09-02 NOTE — Addendum Note (Signed)
Addended by: Jacklyn ShellRESENZO-DISHMON, Erinn Huskins on: 09/02/2014 09:35 AM   Modules accepted: Orders

## 2014-09-02 NOTE — Progress Notes (Signed)
Ur cx +  Rx macrobid 100mg  BID X7

## 2014-09-05 ENCOUNTER — Telehealth: Payer: Self-pay | Admitting: *Deleted

## 2014-09-05 LAB — CYTOLOGY - PAP

## 2014-09-05 NOTE — Telephone Encounter (Signed)
-----   Message from Jacklyn ShellFrances Cresenzo-Dishmon, CNM sent at 09/02/2014  9:33 AM EDT ----- rx macrobid 500mg  BID x 7

## 2014-09-05 NOTE — Telephone Encounter (Signed)
Pt informed of Positive urine culture results from 08/30/2014. Macrobid e-scribed to [facility]. Pt verbalized understanding.

## 2014-09-06 LAB — CBC
HEMATOCRIT: 37.2 % (ref 34.0–46.6)
HEMOGLOBIN: 12.3 g/dL (ref 11.1–15.9)
MCH: 26.7 pg (ref 26.6–33.0)
MCHC: 33.1 g/dL (ref 31.5–35.7)
MCV: 81 fL (ref 79–97)
Platelets: 200 10*3/uL (ref 150–379)
RBC: 4.6 x10E6/uL (ref 3.77–5.28)
RDW: 14.3 % (ref 12.3–15.4)
WBC: 9 10*3/uL (ref 3.4–10.8)

## 2014-09-06 LAB — PMP SCREEN PROFILE (10S), URINE
Amphetamine Screen, Ur: NEGATIVE ng/mL
Barbiturate Screen, Ur: NEGATIVE ng/mL
Benzodiazepine Screen, Urine: NEGATIVE ng/mL
COCAINE(METAB.) SCREEN, URINE: NEGATIVE ng/mL
CREATININE(CRT), U: 110.4 mg/dL (ref 20.0–300.0)
Cannabinoids Ur Ql Scn: POSITIVE ng/mL
Methadone Scn, Ur: NEGATIVE ng/mL
OPIATE SCRN UR: NEGATIVE ng/mL
Oxycodone+Oxymorphone Ur Ql Scn: NEGATIVE ng/mL
PCP SCRN UR: NEGATIVE ng/mL
Ph of Urine: 7.7 (ref 4.5–8.9)
Propoxyphene, Screen: NEGATIVE ng/mL

## 2014-09-06 LAB — URINALYSIS, ROUTINE W REFLEX MICROSCOPIC
BILIRUBIN UA: NEGATIVE
Glucose, UA: NEGATIVE
KETONES UA: NEGATIVE
Leukocytes, UA: NEGATIVE
NITRITE UA: NEGATIVE
PROTEIN UA: NEGATIVE
RBC, UA: NEGATIVE
SPEC GRAV UA: 1.018 (ref 1.005–1.030)
UUROB: 0.2 mg/dL (ref 0.2–1.0)
pH, UA: 7.5 (ref 5.0–7.5)

## 2014-09-06 LAB — HIV ANTIBODY (ROUTINE TESTING W REFLEX): HIV Screen 4th Generation wRfx: NONREACTIVE

## 2014-09-06 LAB — HEPATITIS B SURFACE ANTIGEN: Hepatitis B Surface Ag: NEGATIVE

## 2014-09-06 LAB — RUBELLA SCREEN: RUBELLA: 7.26 {index} (ref >0.99)

## 2014-09-06 LAB — CYSTIC FIBROSIS MUTATION 97: Interpretation: NOT DETECTED

## 2014-09-06 LAB — ANTIBODY SCREEN: Antibody Screen: NEGATIVE

## 2014-09-06 LAB — RPR: RPR: NONREACTIVE

## 2014-09-06 LAB — ABO/RH: Rh Factor: NEGATIVE

## 2014-09-27 ENCOUNTER — Other Ambulatory Visit: Payer: Self-pay

## 2014-09-27 ENCOUNTER — Encounter: Payer: Self-pay | Admitting: Women's Health

## 2014-10-02 ENCOUNTER — Encounter: Payer: Self-pay | Admitting: Women's Health

## 2014-10-04 ENCOUNTER — Ambulatory Visit (INDEPENDENT_AMBULATORY_CARE_PROVIDER_SITE_OTHER): Payer: Medicaid Other | Admitting: Advanced Practice Midwife

## 2014-10-04 ENCOUNTER — Encounter: Payer: Self-pay | Admitting: Advanced Practice Midwife

## 2014-10-04 ENCOUNTER — Ambulatory Visit (INDEPENDENT_AMBULATORY_CARE_PROVIDER_SITE_OTHER): Payer: Medicaid Other

## 2014-10-04 VITALS — BP 110/70 | HR 76 | Wt 181.0 lb

## 2014-10-04 DIAGNOSIS — Z3401 Encounter for supervision of normal first pregnancy, first trimester: Secondary | ICD-10-CM

## 2014-10-04 DIAGNOSIS — Z3682 Encounter for antenatal screening for nuchal translucency: Secondary | ICD-10-CM

## 2014-10-04 DIAGNOSIS — N390 Urinary tract infection, site not specified: Secondary | ICD-10-CM

## 2014-10-04 DIAGNOSIS — Z1389 Encounter for screening for other disorder: Secondary | ICD-10-CM

## 2014-10-04 DIAGNOSIS — Z36 Encounter for antenatal screening of mother: Secondary | ICD-10-CM | POA: Diagnosis not present

## 2014-10-04 DIAGNOSIS — Z331 Pregnant state, incidental: Secondary | ICD-10-CM

## 2014-10-04 LAB — POCT URINALYSIS DIPSTICK
Blood, UA: NEGATIVE
Glucose, UA: NEGATIVE
KETONES UA: NEGATIVE
Leukocytes, UA: NEGATIVE
Nitrite, UA: NEGATIVE
PROTEIN UA: NEGATIVE

## 2014-10-04 MED ORDER — NITROFURANTOIN MONOHYD MACRO 100 MG PO CAPS: 100.0000 mg | ORAL_CAPSULE | Freq: Two times a day (BID) | ORAL | Status: AC

## 2014-10-04 NOTE — Progress Notes (Signed)
[place] 13+4wks single IUP,pos fht 164bpm,NB present,NT 1.64MM,CRL 78.578mm,normal ov's bilat

## 2014-10-04 NOTE — Progress Notes (Signed)
G1P0 129w4d Estimated Date of Delivery: 04/07/15  Blood pressure 110/70, pulse 76, weight 181 lb (82.101 kg), last menstrual period 06/24/2014.   BP weight and urine results all reviewed and noted.  Please refer to the obstetrical flow sheet for the fundal height and fetal heart rate documentation:  Patient reports good fetal movement, denies any bleeding and no rupture of membranes symptoms or regular contractions. Patient is without complaints. All questions were answered.  Plan:  Continued routine obstetrical care, NT/IT today.  Did not take Macrobid because it was too expensive. Has medicaid now.  Rx resent.   Follow up in 4 weeks for OB appointment, 2nd IT

## 2014-10-06 LAB — MATERNAL SCREEN, INTEGRATED #1
CROWN RUMP LENGTH MAT SCREEN: 78.8 mm
Gest. Age on Collection Date: 13.3 weeks
MATERNAL AGE AT EDD: 23.6 a
NUMBER OF FETUSES: 1
Nuchal Translucency (NT): 1.6 mm
PAPP-A Value: 1263.3 ng/mL
Weight: 181 [lb_av]

## 2014-11-01 ENCOUNTER — Ambulatory Visit (INDEPENDENT_AMBULATORY_CARE_PROVIDER_SITE_OTHER): Payer: Medicaid Other | Admitting: Advanced Practice Midwife

## 2014-11-01 VITALS — BP 102/72 | HR 76 | Wt 185.0 lb

## 2014-11-01 DIAGNOSIS — Z3682 Encounter for antenatal screening for nuchal translucency: Secondary | ICD-10-CM

## 2014-11-01 DIAGNOSIS — Z331 Pregnant state, incidental: Secondary | ICD-10-CM

## 2014-11-01 DIAGNOSIS — Z1389 Encounter for screening for other disorder: Secondary | ICD-10-CM

## 2014-11-01 DIAGNOSIS — N390 Urinary tract infection, site not specified: Secondary | ICD-10-CM

## 2014-11-01 DIAGNOSIS — Z363 Encounter for antenatal screening for malformations: Secondary | ICD-10-CM

## 2014-11-01 DIAGNOSIS — Z3402 Encounter for supervision of normal first pregnancy, second trimester: Secondary | ICD-10-CM

## 2014-11-01 LAB — POCT URINALYSIS DIPSTICK
Blood, UA: NEGATIVE
Glucose, UA: NEGATIVE
Ketones, UA: NEGATIVE
Leukocytes, UA: NEGATIVE
NITRITE UA: NEGATIVE
Protein, UA: NEGATIVE

## 2014-11-01 NOTE — Progress Notes (Signed)
G1P0 [redacted]w[redacted]d Estimated Date of Delivery: 04/07/15  Last menstrual period 06/24/2014.   BP weight and urine results all reviewed and noted.  Please refer to the obstetrical flow sheet for the fundal height and fetal heart rate documentation:  Patient denies any bleeding and no rupture of membranes symptoms or regular contractions. Patient is without complaints. All questions were answered.  Plan:  Continued routine obstetrical care, POC urine culture for ASB.  2nd IT today  Follow up in 2 weeks for OB appointment, anatomy scan

## 2014-11-03 LAB — MATERNAL SCREEN, INTEGRATED #2
AFP MARKER: 18.5 ng/mL
AFP MoM: 0.6
Crown Rump Length: 78.8 mm
DIA MoM: 1.58
DIA Value: 238.2 pg/mL
Estriol, Unconjugated: 1.25 ng/mL
GEST. AGE ON COLLECTION DATE: 13.3 wk
Gestational Age: 17.3 weeks
Maternal Age at EDD: 23.6 years
Nuchal Translucency (NT): 1.6 mm
Nuchal Translucency MoM: 0.87
Number of Fetuses: 1
PAPP-A MoM: 1.19
PAPP-A VALUE: 1263.3 ng/mL
Test Results:: NEGATIVE
WEIGHT: 181 [lb_av]
Weight: 185 [lb_av]
hCG MoM: 1.33
hCG Value: 31.6 IU/mL
uE3 MoM: 1.18

## 2014-11-03 LAB — URINE CULTURE

## 2014-11-15 ENCOUNTER — Ambulatory Visit (INDEPENDENT_AMBULATORY_CARE_PROVIDER_SITE_OTHER): Payer: Medicaid Other | Admitting: Women's Health

## 2014-11-15 ENCOUNTER — Encounter: Payer: Self-pay | Admitting: Women's Health

## 2014-11-15 ENCOUNTER — Ambulatory Visit (INDEPENDENT_AMBULATORY_CARE_PROVIDER_SITE_OTHER): Payer: Medicaid Other

## 2014-11-15 VITALS — BP 104/58 | HR 76 | Wt 186.0 lb

## 2014-11-15 DIAGNOSIS — Z36 Encounter for antenatal screening of mother: Secondary | ICD-10-CM | POA: Diagnosis not present

## 2014-11-15 DIAGNOSIS — Z1389 Encounter for screening for other disorder: Secondary | ICD-10-CM

## 2014-11-15 DIAGNOSIS — Z3402 Encounter for supervision of normal first pregnancy, second trimester: Secondary | ICD-10-CM

## 2014-11-15 DIAGNOSIS — Z331 Pregnant state, incidental: Secondary | ICD-10-CM

## 2014-11-15 DIAGNOSIS — Z363 Encounter for antenatal screening for malformations: Secondary | ICD-10-CM

## 2014-11-15 LAB — POCT URINALYSIS DIPSTICK
Blood, UA: NEGATIVE
Glucose, UA: NEGATIVE
Ketones, UA: NEGATIVE
Leukocytes, UA: NEGATIVE
Nitrite, UA: NEGATIVE
PROTEIN UA: NEGATIVE

## 2014-11-15 NOTE — Progress Notes (Signed)
Low-risk OB appointment G1P0 [redacted]w[redacted]d Estimated Date of Delivery: 04/09/15 BP 104/58 mmHg  Pulse 76  Wt 186 lb (84.369 kg)  LMP 06/24/2014  BP, weight, and urine reviewed.  Refer to obstetrical flow sheet for FH & FHR.  Reports good fm.  Denies regular uc's, lof, vb, or uti s/s. No complaints. Reviewed today's anatomy u/s, limited views of spine/heart otherwise normal, will repeat at 28wks. Discussed ptl s/s, fm. Plan:  Continue routine obstetrical care  F/U in 4wks for OB appointment

## 2014-11-15 NOTE — Progress Notes (Signed)
Anatomy US today at 19+[redacted] weeks GA. Single, active female fetus in a breech presentation.  FHR 151 bpm. Posterior Gr0 placenta. Cervix is closed and measures 3.5 cm. Bilateral ovaries appear normal. Fluid is normal with SVP 3.5 cm. EFW today of 308 g which is consistent with dating. Limited views of spine and heart.  Other anatomy visualized and appears normal.

## 2014-12-13 ENCOUNTER — Encounter: Payer: Self-pay | Admitting: Women's Health

## 2014-12-13 ENCOUNTER — Ambulatory Visit (INDEPENDENT_AMBULATORY_CARE_PROVIDER_SITE_OTHER): Payer: Medicaid Other | Admitting: Women's Health

## 2014-12-13 VITALS — BP 124/64 | HR 64 | Wt 193.0 lb

## 2014-12-13 DIAGNOSIS — Z331 Pregnant state, incidental: Secondary | ICD-10-CM

## 2014-12-13 DIAGNOSIS — Z1389 Encounter for screening for other disorder: Secondary | ICD-10-CM

## 2014-12-13 DIAGNOSIS — N898 Other specified noninflammatory disorders of vagina: Secondary | ICD-10-CM | POA: Diagnosis not present

## 2014-12-13 DIAGNOSIS — Z3402 Encounter for supervision of normal first pregnancy, second trimester: Secondary | ICD-10-CM

## 2014-12-13 DIAGNOSIS — O360121 Maternal care for anti-D [Rh] antibodies, second trimester, fetus 1: Secondary | ICD-10-CM

## 2014-12-13 DIAGNOSIS — O26892 Other specified pregnancy related conditions, second trimester: Secondary | ICD-10-CM

## 2014-12-13 DIAGNOSIS — Z363 Encounter for antenatal screening for malformations: Secondary | ICD-10-CM

## 2014-12-13 LAB — POCT WET PREP (WET MOUNT): Clue Cells Wet Prep Whiff POC: NEGATIVE

## 2014-12-13 NOTE — Progress Notes (Signed)
Low-risk OB appointment G1P0 [redacted]w[redacted]d Estimated Date of Delivery: 04/09/15 BP 124/64 mmHg  Pulse 64  Wt 193 lb (87.544 kg)  LMP 06/24/2014  BP, weight, and urine reviewed.  Refer to obstetrical flow sheet for FH & FHR.  Reports good fm.  Denies regular uc's, lof, vb, or uti s/s. White clumpy d/c, no odor/itching/irritation.  Spec exam: cx visually closed, small amount slightly thickened d/c, no clumps, no odor Wet prep neg Reviewed ptl s/s, fm. Plan:  Continue routine obstetrical care  F/U in 4wks for OB appointment, pn2, and f/u anatomy

## 2014-12-13 NOTE — Patient Instructions (Signed)
You will have your sugar test next visit.  Please do not eat or drink anything after midnight the night before you come, not even water.  You will be here for at least two hours.     Call the office ([phone]) or go to [facility] if:  You begin to have strong, frequent contractions  Your water breaks.  Sometimes it is a big gush of fluid, sometimes it is just a trickle that keeps getting your panties wet or running down your legs  You have vaginal bleeding.  It is normal to have a small amount of spotting if your cervix was checked.   You don't feel your baby moving like normal.  If you don't, get you something to eat and drink and lay down and focus on feeling your baby move.   If your baby is still not moving like normal, you should call the office or go to [facility].  Second Trimester of Pregnancy The second trimester is from week 13 through week 28, months 4 through 6. The second trimester is often a time when you feel your best. Your body has also adjusted to being pregnant, and you begin to feel better physically. Usually, morning sickness has lessened or quit completely, you may have more energy, and you may have an increase in appetite. The second trimester is also a time when the fetus is growing rapidly. At the end of the sixth month, the fetus is about 9 inches long and weighs about 1 pounds. You will likely begin to feel the baby move (quickening) between 18 and 20 weeks of the pregnancy. BODY CHANGES Your body goes through many changes during pregnancy. The changes vary from woman to woman.   Your weight will continue to increase. You will notice your lower abdomen bulging out.  You may begin to get stretch marks on your hips, abdomen, and breasts.  You may develop headaches that can be relieved by medicines approved by your health care provider.  You may urinate more often because the fetus is pressing on your bladder.  You may develop or continue to have  heartburn as a result of your pregnancy.  You may develop constipation because certain hormones are causing the muscles that push waste through your intestines to slow down.  You may develop hemorrhoids or swollen, bulging veins (varicose veins).  You may have back pain because of the weight gain and pregnancy hormones relaxing your joints between the bones in your pelvis and as a result of a shift in weight and the muscles that support your balance.  Your breasts will continue to grow and be tender.  Your gums may bleed and may be sensitive to brushing and flossing.  Dark spots or blotches (chloasma, mask of pregnancy) may develop on your face. This will likely fade after the baby is born.  A dark line from your belly button to the pubic area (linea nigra) may appear. This will likely fade after the baby is born.  You may have changes in your hair. These can include thickening of your hair, rapid growth, and changes in texture. Some women also have hair loss during or after pregnancy, or hair that feels dry or thin. Your hair will most likely return to normal after your baby is born. WHAT TO EXPECT AT YOUR PRENATAL VISITS During a routine prenatal visit:  You will be weighed to make sure you and the fetus are growing normally.  Your blood pressure will be taken.    Your abdomen will be measured to track your baby's growth.  The fetal heartbeat will be listened to.  Any test results from the previous visit will be discussed. Your health care provider may ask you:  How you are feeling.  If you are feeling the baby move.  If you have had any abnormal symptoms, such as leaking fluid, bleeding, severe headaches, or abdominal cramping.  If you have any questions. Other tests that may be performed during your second trimester include:  Blood tests that check for:  Low iron levels (anemia).  Gestational diabetes (between 24 and 28 weeks).  Rh antibodies.  Urine tests to check  for infections, diabetes, or protein in the urine.  An ultrasound to confirm the proper growth and development of the baby.  An amniocentesis to check for possible genetic problems.  Fetal screens for spina bifida and Down syndrome. HOME CARE INSTRUCTIONS   Avoid all smoking, herbs, alcohol, and unprescribed drugs. These chemicals affect the formation and growth of the baby.  Follow your health care provider's instructions regarding medicine use. There are medicines that are either safe or unsafe to take during pregnancy.  Exercise only as directed by your health care provider. Experiencing uterine cramps is a good sign to stop exercising.  Continue to eat regular, healthy meals.  Wear a good support bra for breast tenderness.  Do not use hot tubs, steam rooms, or saunas.  Wear your seat belt at all times when driving.  Avoid raw meat, uncooked cheese, cat litter boxes, and soil used by cats. These carry germs that can cause birth defects in the baby.  Take your prenatal vitamins.  Try taking a stool softener (if your health care provider approves) if you develop constipation. Eat more high-fiber foods, such as fresh vegetables or fruit and whole grains. Drink plenty of fluids to keep your urine clear or pale yellow.  Take warm sitz baths to soothe any pain or discomfort caused by hemorrhoids. Use hemorrhoid cream if your health care provider approves.  If you develop varicose veins, wear support hose. Elevate your feet for 15 minutes, 3-4 times a day. Limit salt in your diet.  Avoid heavy lifting, wear low heel shoes, and practice good posture.  Rest with your legs elevated if you have leg cramps or low back pain.  Visit your dentist if you have not gone yet during your pregnancy. Use a soft toothbrush to brush your teeth and be gentle when you floss.  A sexual relationship may be continued unless your health care provider directs you otherwise.  Continue to go to all your  prenatal visits as directed by your health care provider. SEEK MEDICAL CARE IF:   You have dizziness.  You have mild pelvic cramps, pelvic pressure, or nagging pain in the abdominal area.  You have persistent nausea, vomiting, or diarrhea.  You have a bad smelling vaginal discharge.  You have pain with urination. SEEK IMMEDIATE MEDICAL CARE IF:   You have a fever.  You are leaking fluid from your vagina.  You have spotting or bleeding from your vagina.  You have severe abdominal cramping or pain.  You have rapid weight gain or loss.  You have shortness of breath with chest pain.  You notice sudden or extreme swelling of your face, hands, ankles, feet, or legs.  You have not felt your baby move in over an hour.  You have severe headaches that do not go away with medicine.  You have vision changes.   Document Released: 03/18/2001 Document Revised: 03/29/2013 Document Reviewed: 05/25/2012 [employer] Patient Information 2015 [employer]. This information is not intended to replace advice given to you by your health care provider. Make sure you discuss any questions you have with your health care provider.     

## 2015-01-10 ENCOUNTER — Encounter: Payer: Self-pay | Admitting: Women's Health

## 2015-01-10 ENCOUNTER — Ambulatory Visit (INDEPENDENT_AMBULATORY_CARE_PROVIDER_SITE_OTHER): Payer: Medicaid Other

## 2015-01-10 ENCOUNTER — Ambulatory Visit (INDEPENDENT_AMBULATORY_CARE_PROVIDER_SITE_OTHER): Payer: Medicaid Other | Admitting: Women's Health

## 2015-01-10 ENCOUNTER — Other Ambulatory Visit: Payer: Self-pay | Admitting: Women's Health

## 2015-01-10 ENCOUNTER — Other Ambulatory Visit: Payer: Medicaid Other

## 2015-01-10 VITALS — BP 120/60 | HR 84 | Wt 196.0 lb

## 2015-01-10 DIAGNOSIS — Z0373 Encounter for suspected fetal anomaly ruled out: Secondary | ICD-10-CM | POA: Diagnosis not present

## 2015-01-10 DIAGNOSIS — Z369 Encounter for antenatal screening, unspecified: Secondary | ICD-10-CM

## 2015-01-10 DIAGNOSIS — Z331 Pregnant state, incidental: Secondary | ICD-10-CM

## 2015-01-10 DIAGNOSIS — Z0489 Encounter for examination and observation for other specified reasons: Secondary | ICD-10-CM

## 2015-01-10 DIAGNOSIS — Z3402 Encounter for supervision of normal first pregnancy, second trimester: Secondary | ICD-10-CM

## 2015-01-10 DIAGNOSIS — Z1389 Encounter for screening for other disorder: Secondary | ICD-10-CM

## 2015-01-10 DIAGNOSIS — IMO0002 Reserved for concepts with insufficient information to code with codable children: Secondary | ICD-10-CM

## 2015-01-10 DIAGNOSIS — Z131 Encounter for screening for diabetes mellitus: Secondary | ICD-10-CM

## 2015-01-10 LAB — POCT URINALYSIS DIPSTICK
Blood, UA: NEGATIVE
KETONES UA: NEGATIVE
LEUKOCYTES UA: NEGATIVE
Nitrite, UA: NEGATIVE
PROTEIN UA: NEGATIVE

## 2015-01-10 NOTE — Progress Notes (Signed)
Low-risk OB appointment G1P0 [redacted]w[redacted]d Estimated Date of Delivery: 04/09/15 BP 120/60 mmHg  Pulse 84  Wt 196 lb (88.905 kg)  LMP 06/24/2014  BP, weight, and urine reviewed.  Refer to obstetrical flow sheet for FH & FHR.  Reports good fm.  Denies regular uc's, lof, vb, or uti s/s. No complaints. Reviewed today's normal u/s for anatomy f/u, ptl s/s, fkc. Recommended Tdap at HD/PCP per CDC guidelines.  Plan:  Continue routine obstetrical care  F/U in 3wks for OB appointment  PN2 today, wants flu shot next visit

## 2015-01-10 NOTE — Patient Instructions (Addendum)
Call the office ([phone]) or go to [facility] if:  You begin to have strong, frequent contractions  Your water breaks.  Sometimes it is a big gush of fluid, sometimes it is just a trickle that keeps getting your panties wet or running down your legs  You have vaginal bleeding.  It is normal to have a small amount of spotting if your cervix was checked.   You don't feel your baby moving like normal.  If you don't, get you something to eat and drink and lay down and focus on feeling your baby move.  You should feel at least 10 movements in 2 hours.  If you don't, you should call the office or go to [facility].    Tdap Vaccine  It is recommended that you get the Tdap vaccine during the third trimester of EACH pregnancy to help protect your baby from getting pertussis (whooping cough)  27-36 weeks is the BEST time to do this so that you can pass the protection on to your baby. During pregnancy is better than after pregnancy, but if you are unable to get it during pregnancy it will be offered at the hospital.   You can get this vaccine at the health department or your family doctor  Everyone who will be around your baby should also be up-to-date on their vaccines. Adults (who are not pregnant) only need 1 dose of Tdap during adulthood.   [place] Pediatricians/Family Doctors:  [facility] [phone]            [facility] [phone]                 [facility] [phone] (usually not accepting new patients unless you have family there already, you are always welcome to call and ask)            [facility] ([address] [place]) [phone]   [place] Pediatricians/Family Doctors:   [facility]: [phone]  [facility]: [phone]    Third Trimester of Pregnancy The third trimester is from week 29 through week 42, months 7 through 9. The third trimester is a time when the  fetus is growing rapidly. At the end of the ninth month, the fetus is about 20 inches in length and weighs 6-10 pounds.  BODY CHANGES Your body goes through many changes during pregnancy. The changes vary from woman to woman.  9. Your weight will continue to increase. You can expect to gain 25-35 pounds (11-16 kg) by the end of the pregnancy. 10. You may begin to get stretch marks on your hips, abdomen, and breasts. 11. You may urinate more often because the fetus is moving lower into your pelvis and pressing on your bladder. 12. You may develop or continue to have heartburn as a result of your pregnancy. 13. You may develop constipation because certain hormones are causing the muscles that push waste through your intestines to slow down. 14. You may develop hemorrhoids or swollen, bulging veins (varicose veins). 15. You may have pelvic pain because of the weight gain and pregnancy hormones relaxing your joints between the bones in your pelvis. Backaches may result from overexertion of the muscles supporting your posture. 16. You may have changes in your hair. These can include thickening of your hair, rapid growth, and changes in texture. Some women also have hair loss during or after pregnancy, or hair that feels dry or thin. Your hair will most likely return to normal after   your baby is born. 17. Your breasts will continue to grow and be tender. A yellow discharge may leak from your breasts called colostrum. 18. Your belly button may stick out. 19. You may feel short of breath because of your expanding uterus. 20. You may notice the fetus "dropping," or moving lower in your abdomen. 21. You may have a bloody mucus discharge. This usually occurs a few days to a week before labor begins. 22. Your cervix becomes thin and soft (effaced) near your due date. WHAT TO EXPECT AT YOUR PRENATAL EXAMS  You will have prenatal exams every 2 weeks until week 36. Then, you will have weekly prenatal exams.  During a routine prenatal visit: 3. You will be weighed to make sure you and the fetus are growing normally. 4. Your blood pressure is taken. 5. Your abdomen will be measured to track your baby's growth. 6. The fetal heartbeat will be listened to. 7. Any test results from the previous visit will be discussed. 8. You may have a cervical check near your due date to see if you have effaced. At around 36 weeks, your caregiver will check your cervix. At the same time, your caregiver will also perform a test on the secretions of the vaginal tissue. This test is to determine if a type of bacteria, Group B streptococcus, is present. Your caregiver will explain this further. Your caregiver may ask you: 2. What your birth plan is. 3. How you are feeling. 4. If you are feeling the baby move. 5. If you have had any abnormal symptoms, such as leaking fluid, bleeding, severe headaches, or abdominal cramping. 6. If you have any questions. Other tests or screenings that may be performed during your third trimester include: 2. Blood tests that check for low iron levels (anemia). 3. Fetal testing to check the health, activity level, and growth of the fetus. Testing is done if you have certain medical conditions or if there are problems during the pregnancy. FALSE LABOR You may feel small, irregular contractions that eventually go away. These are called Braxton Hicks contractions, or false labor. Contractions may last for hours, days, or even weeks before true labor sets in. If contractions come at regular intervals, intensify, or become painful, it is best to be seen by your caregiver.  SIGNS OF LABOR  3. Menstrual-like cramps. 4. Contractions that are 5 minutes apart or less. 5. Contractions that start on the top of the uterus and spread down to the lower abdomen and back. 6. A sense of increased pelvic pressure or back pain. 7. A watery or bloody mucus discharge that comes from the vagina. If you have any  of these signs before the 37th week of pregnancy, call your caregiver right away. You need to go to the hospital to get checked immediately. HOME CARE INSTRUCTIONS   Avoid all smoking, herbs, alcohol, and unprescribed drugs. These chemicals affect the formation and growth of the baby.  Follow your caregiver's instructions regarding medicine use. There are medicines that are either safe or unsafe to take during pregnancy.  Exercise only as directed by your caregiver. Experiencing uterine cramps is a good sign to stop exercising.  Continue to eat regular, healthy meals.  Wear a good support bra for breast tenderness.  Do not use hot tubs, steam rooms, or saunas.  Wear your seat belt at all times when driving.  Avoid raw meat, uncooked cheese, cat litter boxes, and soil used by cats. These carry germs that can cause birth   defects in the baby.  Take your prenatal vitamins.  Try taking a stool softener (if your caregiver approves) if you develop constipation. Eat more high-fiber foods, such as fresh vegetables or fruit and whole grains. Drink plenty of fluids to keep your urine clear or pale yellow.  Take warm sitz baths to soothe any pain or discomfort caused by hemorrhoids. Use hemorrhoid cream if your caregiver approves.  If you develop varicose veins, wear support hose. Elevate your feet for 15 minutes, 3-4 times a day. Limit salt in your diet.  Avoid heavy lifting, wear low heal shoes, and practice good posture.  Rest a lot with your legs elevated if you have leg cramps or low back pain.  Visit your dentist if you have not gone during your pregnancy. Use a soft toothbrush to brush your teeth and be gentle when you floss.  A sexual relationship may be continued unless your caregiver directs you otherwise.  Do not travel far distances unless it is absolutely necessary and only with the approval of your caregiver.  Take prenatal classes to understand, practice, and ask questions  about the labor and delivery.  Make a trial run to the hospital.  Pack your hospital bag.  Prepare the baby's nursery.  Continue to go to all your prenatal visits as directed by your caregiver. SEEK MEDICAL CARE IF:  You are unsure if you are in labor or if your water has broken.  You have dizziness.  You have mild pelvic cramps, pelvic pressure, or nagging pain in your abdominal area.  You have persistent nausea, vomiting, or diarrhea.  You have a bad smelling vaginal discharge.  You have pain with urination. SEEK IMMEDIATE MEDICAL CARE IF:   You have a fever.  You are leaking fluid from your vagina.  You have spotting or bleeding from your vagina.  You have severe abdominal cramping or pain.  You have rapid weight loss or gain.  You have shortness of breath with chest pain.  You notice sudden or extreme swelling of your face, hands, ankles, feet, or legs.  You have not felt your baby move in over an hour.  You have severe headaches that do not go away with medicine.  You have vision changes. Document Released: 03/18/2001 Document Revised: 03/29/2013 Document Reviewed: 05/25/2012 [employer] Patient Information 2015 [employer]. This information is not intended to replace advice given to you by your health care provider. Make sure you discuss any questions you have with your health care provider.   

## 2015-01-10 NOTE — Progress Notes (Signed)
[place] 27+2wks,measurements c/w dates,efw 1087g 54.6%,cephalic,normal ov's bilat,post pl gr 0,afi 15.4cm,anatomy of the heart and spine complete,no obvious abn seen

## 2015-01-11 LAB — CBC
HEMOGLOBIN: 11 g/dL — AB (ref 11.1–15.9)
Hematocrit: 32 % — ABNORMAL LOW (ref 34.0–46.6)
MCH: 28.4 pg (ref 26.6–33.0)
MCHC: 34.4 g/dL (ref 31.5–35.7)
MCV: 83 fL (ref 79–97)
Platelets: 174 10*3/uL (ref 150–379)
RBC: 3.88 x10E6/uL (ref 3.77–5.28)
RDW: 12.3 % (ref 12.3–15.4)
WBC: 11 10*3/uL — ABNORMAL HIGH (ref 3.4–10.8)

## 2015-01-11 LAB — GLUCOSE TOLERANCE, 2 HOURS W/ 1HR
GLUCOSE, 1 HOUR: 161 mg/dL (ref 65–179)
GLUCOSE, 2 HOUR: 144 mg/dL (ref 65–152)
Glucose, Fasting: 80 mg/dL (ref 65–91)

## 2015-01-11 LAB — HIV ANTIBODY (ROUTINE TESTING W REFLEX): HIV Screen 4th Generation wRfx: NONREACTIVE

## 2015-01-11 LAB — ANTIBODY SCREEN: ANTIBODY SCREEN: NEGATIVE

## 2015-01-11 LAB — RPR: RPR: NONREACTIVE

## 2015-01-31 ENCOUNTER — Ambulatory Visit (INDEPENDENT_AMBULATORY_CARE_PROVIDER_SITE_OTHER): Payer: Medicaid Other | Admitting: Women's Health

## 2015-01-31 VITALS — BP 102/70 | HR 87 | Wt 199.0 lb

## 2015-01-31 DIAGNOSIS — Z1389 Encounter for screening for other disorder: Secondary | ICD-10-CM

## 2015-01-31 DIAGNOSIS — O360931 Maternal care for other rhesus isoimmunization, third trimester, fetus 1: Secondary | ICD-10-CM

## 2015-01-31 DIAGNOSIS — Z23 Encounter for immunization: Secondary | ICD-10-CM | POA: Diagnosis not present

## 2015-01-31 DIAGNOSIS — O360131 Maternal care for anti-D [Rh] antibodies, third trimester, fetus 1: Secondary | ICD-10-CM

## 2015-01-31 DIAGNOSIS — Z3403 Encounter for supervision of normal first pregnancy, third trimester: Secondary | ICD-10-CM

## 2015-01-31 DIAGNOSIS — Z331 Pregnant state, incidental: Secondary | ICD-10-CM

## 2015-01-31 LAB — POCT URINALYSIS DIPSTICK
GLUCOSE UA: NEGATIVE
Ketones, UA: NEGATIVE
Leukocytes, UA: NEGATIVE
Nitrite, UA: NEGATIVE
Protein, UA: NEGATIVE
RBC UA: NEGATIVE

## 2015-01-31 MED ORDER — RHO D IMMUNE GLOBULIN 1500 UNIT/2ML IJ SOSY
300.0000 ug | PREFILLED_SYRINGE | Freq: Once | INTRAMUSCULAR | Status: AC
  Administered 2015-01-31: 300 ug via INTRAMUSCULAR

## 2015-01-31 NOTE — Patient Instructions (Addendum)
Call the office ([phone]) or go to [facility] if:  You begin to have strong, frequent contractions  Your water breaks.  Sometimes it is a big gush of fluid, sometimes it is just a trickle that keeps getting your panties wet or running down your legs  You have vaginal bleeding.  It is normal to have a small amount of spotting if your cervix was checked.   You don't feel your baby moving like normal.  If you don't, get you something to eat and drink and lay down and focus on feeling your baby move.  You should feel at least 10 movements in 2 hours.  If you don't, you should call the office or go to [facility].    Tdap Vaccine  It is recommended that you get the Tdap vaccine during the third trimester of EACH pregnancy to help protect your baby from getting pertussis (whooping cough)  27-36 weeks is the BEST time to do this so that you can pass the protection on to your baby. During pregnancy is better than after pregnancy, but if you are unable to get it during pregnancy it will be offered at the hospital.   You can get this vaccine at the health department or your family doctor  Everyone who will be around your baby should also be up-to-date on their vaccines. Adults (who are not pregnant) only need 1 dose of Tdap during adulthood.      Preterm Labor Information Preterm labor is when labor starts at less than 37 weeks of pregnancy. The normal length of a pregnancy is 39 to 41 weeks. CAUSES Often, there is no identifiable underlying cause as to why a woman goes into preterm labor. One of the most common known causes of preterm labor is infection. Infections of the uterus, cervix, vagina, amniotic sac, bladder, kidney, or even the lungs (pneumonia) can cause labor to start. Other suspected causes of preterm labor include:  8. Urogenital infections, such as yeast infections and bacterial vaginosis.  9. Uterine abnormalities (uterine shape, uterine septum, fibroids, or bleeding  from the placenta).  10. A cervix that has been operated on (it may fail to stay closed).  11. Malformations in the fetus.  12. Multiple gestations (twins, triplets, and so on).  13. Breakage of the amniotic sac.  RISK FACTORS 2. Having a previous history of preterm labor.  3. Having premature rupture of membranes (PROM).  4. Having a placenta that covers the opening of the cervix (placenta previa).  5. Having a placenta that separates from the uterus (placental abruption).  6. Having a cervix that is too weak to hold the fetus in the uterus (incompetent cervix).  7. Having too much fluid in the amniotic sac (polyhydramnios).  8. Taking illegal drugs or smoking while pregnant.  9. Not gaining enough weight while pregnant.  10. Being younger than 18 and older than 23 years old.  11. Having a low socioeconomic status.  12. Being African American. SYMPTOMS Signs and symptoms of preterm labor include:   Menstrual-like cramps, abdominal pain, or back pain.  Uterine contractions that are regular, as frequent as six in an hour, regardless of their intensity (may be mild or painful).  Contractions that start on the top of the uterus and spread down to the lower abdomen and back.   A sense of increased pelvic pressure.   A watery or bloody mucus discharge that comes from the vagina.  TREATMENT Depending on the length of the pregnancy and   other circumstances, your health care provider may suggest bed rest. If necessary, there are medicines that can be given to stop contractions and to mature the fetal lungs. If labor happens before 34 weeks of pregnancy, a prolonged hospital stay may be recommended. Treatment depends on the condition of both you and the fetus.  WHAT SHOULD YOU DO IF YOU THINK YOU ARE IN PRETERM LABOR? Call your health care provider right away. You will need to go to the hospital to get checked immediately. HOW CAN YOU PREVENT PRETERM LABOR IN FUTURE  PREGNANCIES? You should:   Stop smoking if you smoke.  Maintain healthy weight gain and avoid chemicals and drugs that are not necessary.  Be watchful for any type of infection.  Inform your health care provider if you have a known history of preterm labor.   This information is not intended to replace advice given to you by your health care provider. Make sure you discuss any questions you have with your health care provider.   Document Released: 06/14/2003 Document Revised: 11/24/2012 Document Reviewed: 04/26/2012 Elsevier Interactive Patient Education 2016 [employer]  

## 2015-01-31 NOTE — Progress Notes (Signed)
Low-risk OB appointment [id number] Estimated Date of Delivery: 04/09/15 BP 102/70 mmHg  Pulse 87  Wt 199 lb (90.266 kg)  LMP 06/24/2014  BP, weight, and urine reviewed.  Refer to obstetrical flow sheet for FH & [facility]HR.  Reports good fm.  Denies regular uc's, lof, vb, or uti s/s. Panic attacks lately- has a stressful situation which she didn't elaborate on- wanted ways to help calm her down- discussed deep breathing exercises and other stress relieving exercises.  Reviewed normal pn2 results, ptl s/s, fkc. Recommended Tdap at HD/PCP per CDC guidelines.  Plan:  Continue routine obstetrical care  F/U in 2wks for OB appointment  Flu shot & rhogam today

## 2015-02-14 ENCOUNTER — Ambulatory Visit (INDEPENDENT_AMBULATORY_CARE_PROVIDER_SITE_OTHER): Payer: Medicaid Other | Admitting: Women's Health

## 2015-02-14 ENCOUNTER — Encounter: Payer: Self-pay | Admitting: Women's Health

## 2015-02-14 VITALS — BP 120/62 | HR 100 | Wt 204.0 lb

## 2015-02-14 DIAGNOSIS — Z1389 Encounter for screening for other disorder: Secondary | ICD-10-CM

## 2015-02-14 DIAGNOSIS — Z3403 Encounter for supervision of normal first pregnancy, third trimester: Secondary | ICD-10-CM

## 2015-02-14 DIAGNOSIS — Z331 Pregnant state, incidental: Secondary | ICD-10-CM

## 2015-02-14 LAB — POCT URINALYSIS DIPSTICK
Blood, UA: NEGATIVE
KETONES UA: NEGATIVE
LEUKOCYTES UA: NEGATIVE
Nitrite, UA: NEGATIVE
Protein, UA: NEGATIVE

## 2015-02-14 NOTE — Patient Instructions (Signed)
Call the office [phone]) or go to [facility] if:  You begin to have strong, frequent contractions  Your water breaks.  Sometimes it is a big gush of fluid, sometimes it is just a trickle that keeps getting your panties wet or running down your legs  You have vaginal bleeding.  It is normal to have a small amount of spotting if your cervix was checked.   You don't feel your baby moving like normal.  If you don't, get you something to eat and drink and lay down and focus on feeling your baby move.  You should feel at least 10 movements in 2 hours.  If you don't, you should call the office or go to [facility]    Preterm Labor Information Preterm labor is when labor starts at less than 37 weeks of pregnancy. The normal length of a pregnancy is 39 to 41 weeks. CAUSES Often, there is no identifiable underlying cause as to why a woman goes into preterm labor. One of the most common known causes of preterm labor is infection. Infections of the uterus, cervix, vagina, amniotic sac, bladder, kidney, or even the lungs (pneumonia) can cause labor to start. Other suspected causes of preterm labor include:   Urogenital infections, such as yeast infections and bacterial vaginosis.   Uterine abnormalities (uterine shape, uterine septum, fibroids, or bleeding from the placenta).   A cervix that has been operated on (it may fail to stay closed).   Malformations in the fetus.   Multiple gestations (twins, triplets, and so on).   Breakage of the amniotic sac.  RISK FACTORS 1. Having a previous history of preterm labor.  2. Having premature rupture of membranes (PROM).  3. Having a placenta that covers the opening of the cervix (placenta previa).  4. Having a placenta that separates from the uterus (placental abruption).  5. Having a cervix that is too weak to hold the fetus in the uterus (incompetent cervix).  6. Having too much fluid in the amniotic sac (polyhydramnios).   7. Taking illegal drugs or smoking while pregnant.  8. Not gaining enough weight while pregnant.  9. Being younger than 4 and older than 23 years old.  10. Having a low socioeconomic status.  11. Being African American. SYMPTOMS Signs and symptoms of preterm labor include:   Menstrual-like cramps, abdominal pain, or back pain.  Uterine contractions that are regular, as frequent as six in an hour, regardless of their intensity (may be mild or painful).  Contractions that start on the top of the uterus and spread down to the lower abdomen and back.   A sense of increased pelvic pressure.   A watery or bloody mucus discharge that comes from the vagina.  TREATMENT Depending on the length of the pregnancy and other circumstances, your health care provider may suggest bed rest. If necessary, there are medicines that can be given to stop contractions and to mature the fetal lungs. If labor happens before 34 weeks of pregnancy, a prolonged hospital stay may be recommended. Treatment depends on the condition of both you and the fetus.  WHAT SHOULD YOU DO IF YOU THINK YOU ARE IN PRETERM LABOR? Call your health care provider right away. You will need to go to the hospital to get checked immediately. HOW CAN YOU PREVENT PRETERM LABOR IN FUTURE PREGNANCIES? You should:   Stop smoking if you smoke.  Maintain healthy weight gain and avoid chemicals and drugs that are not necessary.  Be watchful for  any type of infection.  Inform your health care provider if you have a known history of preterm labor.   This information is not intended to replace advice given to you by your health care provider. Make sure you discuss any questions you have with your health care provider.   Document Released: 06/14/2003 Document Revised: 11/24/2012 Document Reviewed: 04/26/2012 Elsevier Interactive Patient Education 2016 Else[employer] Inc.  Fetal Movement Counts Patient Name:  __________________________________________________ Patient Due Date: ____________________ Performing a fetal movement count is highly recommended in high-risk pregnancies, but it is good for every pregnant woman to do. Your health care provider may ask you to start counting fetal movements at 28 weeks of the pregnancy. Fetal movements often increase:  After eating a full meal.  After physical activity.  After eating or drinking something sweet or cold.  At rest. Pay attention to when you feel the baby is most active. This will help you notice a pattern of your baby's sleep and wake cycles and what factors contribute to an increase in fetal movement. It is important to perform a fetal movement count at the same time each day when your baby is normally most active.  HOW TO COUNT FETAL MOVEMENTS 12. Find a quiet and comfortable area to sit or lie down on your left side. Lying on your left side provides the best blood and oxygen circulation to your baby. 13. Write down the day and time on a sheet of paper or in a journal. 14. Start counting kicks, flutters, swishes, rolls, or jabs in a 2-hour period. You should feel at least 10 movements within 2 hours. 15. If you do not feel 10 movements in 2 hours, wait 2-3 hours and count again. Look for a change in the pattern or not enough counts in 2 hours. SEEK MEDICAL CARE IF:  You feel less than 10 counts in 2 hours, tried twice.  There is no movement in over an hour.  The pattern is changing or taking longer each day to reach 10 counts in 2 hours.  You feel the baby is not moving as he or she usually does. Date: ____________ Movements: ____________ Start time: ____________ Andrea MartinFinish time: ____________  Date: ____________ Movements: ____________ Start time: ____________ Andrea MartinFinish time: ____________ Date: ____________ Movements: ____________ Start time: ____________ Andrea MartinFinish time: ____________ Date: ____________ Movements: ____________ Start time:  ____________ Andrea MartinFinish time: ____________ Date: ____________ Movements: ____________ Start time: ____________ Andrea MartinFinish time: ____________ Date: ____________ Movements: ____________ Start time: ____________ Andrea MartinFinish time: ____________ Date: ____________ Movements: ____________ Start time: ____________ Andrea MartinFinish time: ____________ Date: ____________ Movements: ____________ Start time: ____________ Andrea MartinFinish time: ____________  Date: ____________ Movements: ____________ Start time: ____________ Andrea MartinFinish time: ____________ Date: ____________ Movements: ____________ Start time: ____________ Andrea MartinFinish time: ____________ Date: ____________ Movements: ____________ Start time: ____________ Andrea MartinFinish time: ____________ Date: ____________ Movements: ____________ Start time: ____________ Andrea MartinFinish time: ____________ Date: ____________ Movements: ____________ Start time: ____________ Andrea MartinFinish time: ____________ Date: ____________ Movements: ____________ Start time: ____________ Andrea MartinFinish time: ____________ Date: ____________ Movements: ____________ Start time: ____________ Andrea MartinFinish time: ____________  Date: ____________ Movements: ____________ Start time: ____________ Andrea MartinFinish time: ____________ Date: ____________ Movements: ____________ Start time: ____________ Andrea MartinFinish time: ____________ Date: ____________ Movements: ____________ Start time: ____________ Andrea MartinFinish time: ____________ Date: ____________ Movements: ____________ Start time: ____________ Andrea MartinFinish time: ____________ Date: ____________ Movements: ____________ Start time: ____________ Andrea MartinFinish time: ____________ Date: ____________ Movements: ____________ Start time: ____________ Andrea MartinFinish time: ____________ Date: ____________ Movements: ____________ Start time: ____________ Andrea MartinFinish time: ____________  Date: ____________ Movements: ____________ Start time: ____________ Andrea MartinFinish time: ____________  Date: ____________ Movements: ____________ Start time: ____________ Andrea Stephenson time: ____________ Date:  ____________ Movements: ____________ Start time: ____________ Andrea Stephenson time: ____________ Date: ____________ Movements: ____________ Start time: ____________ Andrea Stephenson time: ____________ Date: ____________ Movements: ____________ Start time: ____________ Andrea Stephenson time: ____________ Date: ____________ Movements: ____________ Start time: ____________ Andrea Stephenson time: ____________ Date: ____________ Movements: ____________ Start time: ____________ Andrea Stephenson time: ____________  Date: ____________ Movements: ____________ Start time: ____________ Andrea Stephenson time: ____________ Date: ____________ Movements: ____________ Start time: ____________ Andrea Stephenson time: ____________ Date: ____________ Movements: ____________ Start time: ____________ Andrea Stephenson time: ____________ Date: ____________ Movements: ____________ Start time: ____________ Andrea Stephenson time: ____________ Date: ____________ Movements: ____________ Start time: ____________ Andrea Stephenson time: ____________ Date: ____________ Movements: ____________ Start time: ____________ Andrea Stephenson time: ____________ Date: ____________ Movements: ____________ Start time: ____________ Andrea Stephenson time: ____________  Date: ____________ Movements: ____________ Start time: ____________ Andrea Stephenson time: ____________ Date: ____________ Movements: ____________ Start time: ____________ Andrea Stephenson time: ____________ Date: ____________ Movements: ____________ Start time: ____________ Andrea Stephenson time: ____________ Date: ____________ Movements: ____________ Start time: ____________ Andrea Stephenson time: ____________ Date: ____________ Movements: ____________ Start time: ____________ Andrea Stephenson time: ____________ Date: ____________ Movements: ____________ Start time: ____________ Andrea Stephenson time: ____________ Date: ____________ Movements: ____________ Start time: ____________ Andrea Stephenson time: ____________  Date: ____________ Movements: ____________ Start time: ____________ Andrea Stephenson time: ____________ Date: ____________ Movements: ____________ Start  time: ____________ Andrea Stephenson time: ____________ Date: ____________ Movements: ____________ Start time: ____________ Andrea Stephenson time: ____________ Date: ____________ Movements: ____________ Start time: ____________ Andrea Stephenson time: ____________ Date: ____________ Movements: ____________ Start time: ____________ Andrea Stephenson time: ____________ Date: ____________ Movements: ____________ Start time: ____________ Andrea Stephenson time: ____________ Date: ____________ Movements: ____________ Start time: ____________ Andrea Stephenson time: ____________  Date: ____________ Movements: ____________ Start time: ____________ Andrea Stephenson time: ____________ Date: ____________ Movements: ____________ Start time: ____________ Andrea Stephenson time: ____________ Date: ____________ Movements: ____________ Start time: ____________ Andrea Stephenson time: ____________ Date: ____________ Movements: ____________ Start time: ____________ Andrea Stephenson time: ____________ Date: ____________ Movements: ____________ Start time: ____________ Andrea Stephenson time: ____________ Date: ____________ Movements: ____________ Start time: ____________ Andrea Stephenson time: ____________   This information is not intended to replace advice given to you by your health care provider. Make sure you discuss any questions you have with your health care provider.   Document Released: 04/23/2006 Document Revised: 04/14/2014 Document Reviewed: 01/19/2012 Elsevier Interactive Patient Education [employer]

## 2015-02-14 NOTE — Progress Notes (Signed)
Low-risk OB appointment G1P0 30100w2d Estimated Date of Delivery: 04/09/15 BP 120/62 mmHg  Pulse 100  Wt 204 lb (92.534 kg)  LMP 06/24/2014  BP, weight, and urine reviewed.  Refer to obstetrical flow sheet for FH & FHR.  Reports good fm today, although occ feels like baby not moving as much as she should.  Denies regular uc's, lof, vb, or uti s/s. No complaints.  Reviewed ptl s/s, fkc. Reiterated how to do fkc and to call/go to [facility] if <[id number]/2hr Plan:  Continue routine obstetrical care  F/U in 2wks for OB appointment

## 2015-02-28 ENCOUNTER — Ambulatory Visit (INDEPENDENT_AMBULATORY_CARE_PROVIDER_SITE_OTHER): Payer: Medicaid Other | Admitting: Women's Health

## 2015-02-28 ENCOUNTER — Encounter: Payer: Self-pay | Admitting: Women's Health

## 2015-02-28 VITALS — BP 118/72 | HR 72 | Wt 206.0 lb

## 2015-02-28 DIAGNOSIS — Z331 Pregnant state, incidental: Secondary | ICD-10-CM | POA: Diagnosis not present

## 2015-02-28 DIAGNOSIS — N898 Other specified noninflammatory disorders of vagina: Secondary | ICD-10-CM | POA: Diagnosis not present

## 2015-02-28 DIAGNOSIS — Z3403 Encounter for supervision of normal first pregnancy, third trimester: Secondary | ICD-10-CM

## 2015-02-28 DIAGNOSIS — Z1389 Encounter for screening for other disorder: Secondary | ICD-10-CM

## 2015-02-28 DIAGNOSIS — O26893 Other specified pregnancy related conditions, third trimester: Secondary | ICD-10-CM | POA: Diagnosis not present

## 2015-02-28 LAB — POCT URINALYSIS DIPSTICK
Glucose, UA: NEGATIVE
Ketones, UA: NEGATIVE
LEUKOCYTES UA: NEGATIVE
Nitrite, UA: NEGATIVE
Protein, UA: NEGATIVE
RBC UA: NEGATIVE

## 2015-02-28 LAB — POCT WET PREP (WET MOUNT): CLUE CELLS WET PREP WHIFF POC: NEGATIVE

## 2015-02-28 NOTE — Progress Notes (Signed)
Low-risk OB appointment G1P0 231w2d Estimated Date of Delivery: 04/09/15 BP 118/72 mmHg  Pulse 72  Wt 206 lb (93.441 kg)  LMP 06/24/2014  BP, weight, and urine reviewed.  Refer to obstetrical flow sheet for FH & FHR.  Reports good fm.  Denies regular uc's, vb, or uti s/s. Watery d/c x couple weeks. No odor/itching/irritation SSE: cx visually closed, no pooling, no change w/ valsalva, nitrazine and fern neg, wet prep neg Reviewed ptl s/s, fkc. Plan:  Continue routine obstetrical care  F/U in 2wks for OB appointment

## 2015-02-28 NOTE — Patient Instructions (Signed)
Call the office ([phone]) or go to [facility] if:  You begin to have strong, frequent contractions  Your water breaks.  Sometimes it is a big gush of fluid, sometimes it is just a trickle that keeps getting your panties wet or running down your legs  You have vaginal bleeding.  It is normal to have a small amount of spotting if your cervix was checked.   You don't feel your baby moving like normal.  If you don't, get you something to eat and drink and lay down and focus on feeling your baby move.  You should feel at least 10 movements in 2 hours.  If you don't, you should call the office or go to [facility].    Preterm Labor Information Preterm labor is when labor starts at less than 37 weeks of pregnancy. The normal length of a pregnancy is 39 to 41 weeks. CAUSES Often, there is no identifiable underlying cause as to why a woman goes into preterm labor. One of the most common known causes of preterm labor is infection. Infections of the uterus, cervix, vagina, amniotic sac, bladder, kidney, or even the lungs (pneumonia) can cause labor to start. Other suspected causes of preterm labor include:   Urogenital infections, such as yeast infections and bacterial vaginosis.   Uterine abnormalities (uterine shape, uterine septum, fibroids, or bleeding from the placenta).   A cervix that has been operated on (it may fail to stay closed).   Malformations in the fetus.   Multiple gestations (twins, triplets, and so on).   Breakage of the amniotic sac.  RISK FACTORS  Having a previous history of preterm labor.   Having premature rupture of membranes (PROM).   Having a placenta that covers the opening of the cervix (placenta previa).   Having a placenta that separates from the uterus (placental abruption).   Having a cervix that is too weak to hold the fetus in the uterus (incompetent cervix).   Having too much fluid in the amniotic sac (polyhydramnios).   Taking  illegal drugs or smoking while pregnant.   Not gaining enough weight while pregnant.   Being younger than 18 and older than 23 years old.   Having a low socioeconomic status.   Being African American. SYMPTOMS Signs and symptoms of preterm labor include:   Menstrual-like cramps, abdominal pain, or back pain.  Uterine contractions that are regular, as frequent as six in an hour, regardless of their intensity (may be mild or painful).  Contractions that start on the top of the uterus and spread down to the lower abdomen and back.   A sense of increased pelvic pressure.   A watery or bloody mucus discharge that comes from the vagina.  TREATMENT Depending on the length of the pregnancy and other circumstances, your health care provider may suggest bed rest. If necessary, there are medicines that can be given to stop contractions and to mature the fetal lungs. If labor happens before 34 weeks of pregnancy, a prolonged hospital stay may be recommended. Treatment depends on the condition of both you and the fetus.  WHAT SHOULD YOU DO IF YOU THINK YOU ARE IN PRETERM LABOR? Call your health care provider right away. You will need to go to the hospital to get checked immediately. HOW CAN YOU PREVENT PRETERM LABOR IN FUTURE PREGNANCIES? You should:   Stop smoking if you smoke.  Maintain healthy weight gain and avoid chemicals and drugs that are not necessary.  Be watchful for   any type of infection.  Inform your health care provider if you have a known history of preterm labor.   This information is not intended to replace advice given to you by your health care provider. Make sure you discuss any questions you have with your health care provider.   Document Released: 06/14/2003 Document Revised: 11/24/2012 Document Reviewed: 04/26/2012 Elsevier Interactive Patient Education 2016 Elsevier Inc.  

## 2015-03-14 ENCOUNTER — Ambulatory Visit (INDEPENDENT_AMBULATORY_CARE_PROVIDER_SITE_OTHER): Payer: Medicaid Other | Admitting: Obstetrics and Gynecology

## 2015-03-14 ENCOUNTER — Encounter: Payer: Self-pay | Admitting: Obstetrics and Gynecology

## 2015-03-14 VITALS — BP 120/60 | HR 77 | Wt 206.5 lb

## 2015-03-14 DIAGNOSIS — Z1389 Encounter for screening for other disorder: Secondary | ICD-10-CM

## 2015-03-14 DIAGNOSIS — Z3403 Encounter for supervision of normal first pregnancy, third trimester: Secondary | ICD-10-CM

## 2015-03-14 DIAGNOSIS — Z331 Pregnant state, incidental: Secondary | ICD-10-CM

## 2015-03-14 LAB — POCT URINALYSIS DIPSTICK
Blood, UA: 3
GLUCOSE UA: NEGATIVE
KETONES UA: NEGATIVE
Nitrite, UA: NEGATIVE
PROTEIN UA: NEGATIVE

## 2015-03-14 NOTE — Progress Notes (Signed)
Pt denies any problems or concerns at this time.  

## 2015-03-14 NOTE — Progress Notes (Signed)
Patient ID: Andrea PeerJulia Stephenson, female   DOB: 03/02/1991, 24 y.o.   MRN: [id number]  G1P0 6586w2d Estimated Date of Delivery: 04/09/15  Blood pressure 120/60, pulse 77, weight 206 lb 8 oz (93.668 kg), last menstrual period 06/24/2014.   refer to the ob flow sheet for FH and FHR, also BP, Wt, Urine results:notable for 3+ blood, trace leukocytes  Patient reports  + good fetal movement, denies any bleeding and no rupture of membranes symptoms or regular contractions. Patient complaints: None. She denies dysuria. She states FOB is currently incarcerated for violation of probation but wants to be involved in caring for the baby; he is scheduled to be released in April, per pt.   FHR: 147 bpm FH: 34 cm  Questions were answered. Assessment: LROB G1P0 @ 186w2d   Plan:  Continued routine obstetrical care,   3+ blood, trace leukocytes on urine dipstick. Pt is asymptomatic.  F/u in 1 weeks for LROB pnx care  GBS test  Repeat GC/CHL  Will send urine culture. Instructed pt to notify us if she develops any dysuria or other UTI symptoms  [facility]    By signing my name below, I, Ronney LionSuzanne Le, attest that this documentation has been prepared under the direction and in the presence of Tilda BurrowJohn V Anmarie Fukushima, MD. Electronically Signed: Ronney LionSuzanne Le, ED Scribe. 03/14/2015. 3:03 PM.   I personally performed the services described in this documentation, which was SCRIBED in my presence. The recorded information has been reviewed and considered accurate. It has been edited as necessary during review. Tilda BurrowFERGUSON,Grey Schlauch V, MD  (scribe attestation statement)

## 2015-03-22 ENCOUNTER — Encounter: Payer: Medicaid Other | Admitting: Women's Health

## 2015-03-22 ENCOUNTER — Ambulatory Visit (INDEPENDENT_AMBULATORY_CARE_PROVIDER_SITE_OTHER): Payer: Medicaid Other | Admitting: Obstetrics & Gynecology

## 2015-03-22 ENCOUNTER — Encounter: Payer: Self-pay | Admitting: Obstetrics & Gynecology

## 2015-03-22 VITALS — BP 110/80 | HR 76 | Wt 210.0 lb

## 2015-03-22 DIAGNOSIS — Z1159 Encounter for screening for other viral diseases: Secondary | ICD-10-CM

## 2015-03-22 DIAGNOSIS — Z3403 Encounter for supervision of normal first pregnancy, third trimester: Secondary | ICD-10-CM

## 2015-03-22 DIAGNOSIS — Z331 Pregnant state, incidental: Secondary | ICD-10-CM

## 2015-03-22 DIAGNOSIS — Z118 Encounter for screening for other infectious and parasitic diseases: Secondary | ICD-10-CM

## 2015-03-22 DIAGNOSIS — Z3685 Encounter for antenatal screening for Streptococcus B: Secondary | ICD-10-CM

## 2015-03-22 DIAGNOSIS — Z3A37 37 weeks gestation of pregnancy: Secondary | ICD-10-CM

## 2015-03-22 DIAGNOSIS — Z1389 Encounter for screening for other disorder: Secondary | ICD-10-CM

## 2015-03-22 LAB — POCT URINALYSIS DIPSTICK
Blood, UA: NEGATIVE
Glucose, UA: NEGATIVE
Ketones, UA: NEGATIVE
NITRITE UA: NEGATIVE
PROTEIN UA: NEGATIVE

## 2015-03-22 LAB — OB RESULTS CONSOLE GBS: GBS: POSITIVE

## 2015-03-22 NOTE — Progress Notes (Signed)
G1P0 6855w3d Estimated Date of Delivery: 04/09/15  Blood pressure 110/80, pulse 76, weight 210 lb (95.255 kg), last menstrual period 06/24/2014.   BP weight and urine results all reviewed and noted.  Please refer to the obstetrical flow sheet for the fundal height and fetal heart rate documentation:  Patient reports good fetal movement, denies any bleeding and no rupture of membranes symptoms or regular contractions. Patient is without complaints. All questions were answered.  Orders Placed This Encounter  Procedures  . GC/Chlamydia Probe Amp  . Strep Gp B NAA  . POCT urinalysis dipstick    Plan:  Continued routine obstetrical care, GBS done  Return in about 1 week (around 03/29/2015) for LROB.

## 2015-03-23 LAB — GC/CHLAMYDIA PROBE AMP
Chlamydia trachomatis, NAA: NEGATIVE
NEISSERIA GONORRHOEAE BY PCR: NEGATIVE

## 2015-03-24 LAB — STREP GP B NAA: Strep Gp B NAA: POSITIVE — AB

## 2015-03-29 ENCOUNTER — Ambulatory Visit (INDEPENDENT_AMBULATORY_CARE_PROVIDER_SITE_OTHER): Payer: Medicaid Other | Admitting: Obstetrics & Gynecology

## 2015-03-29 ENCOUNTER — Encounter: Payer: Self-pay | Admitting: Obstetrics & Gynecology

## 2015-03-29 VITALS — BP 110/80 | HR 80 | Wt 211.0 lb

## 2015-03-29 DIAGNOSIS — Z1389 Encounter for screening for other disorder: Secondary | ICD-10-CM

## 2015-03-29 DIAGNOSIS — Z331 Pregnant state, incidental: Secondary | ICD-10-CM

## 2015-03-29 DIAGNOSIS — Z3A38 38 weeks gestation of pregnancy: Secondary | ICD-10-CM

## 2015-03-29 DIAGNOSIS — Z3403 Encounter for supervision of normal first pregnancy, third trimester: Secondary | ICD-10-CM

## 2015-03-29 LAB — POCT URINALYSIS DIPSTICK
Blood, UA: NEGATIVE
GLUCOSE UA: NEGATIVE
Ketones, UA: NEGATIVE
NITRITE UA: NEGATIVE
Protein, UA: NEGATIVE

## 2015-03-29 NOTE — Progress Notes (Signed)
G1P0 6233w3d Estimated Date of Delivery: 04/09/15  Blood pressure 110/80, pulse 80, weight 211 lb (95.709 kg), last menstrual period 06/24/2014.   BP weight and urine results all reviewed and noted.  Please refer to the obstetrical flow sheet for the fundal height and fetal heart rate documentation:  Patient reports good fetal movement, denies any bleeding and no rupture of membranes symptoms or regular contractions. Patient is without complaints. All questions were answered.  Orders Placed This Encounter  Procedures  . POCT urinalysis dipstick    Plan:  Continued routine obstetrical care,   Return in about 1 week (around 04/05/2015) for LROB.

## 2015-04-03 ENCOUNTER — Encounter: Payer: Medicaid Other | Admitting: Advanced Practice Midwife

## 2015-04-05 ENCOUNTER — Ambulatory Visit (INDEPENDENT_AMBULATORY_CARE_PROVIDER_SITE_OTHER): Payer: Medicaid Other | Admitting: Obstetrics and Gynecology

## 2015-04-05 ENCOUNTER — Encounter: Payer: Self-pay | Admitting: Obstetrics and Gynecology

## 2015-04-05 VITALS — BP 114/70 | HR 94 | Wt 212.2 lb

## 2015-04-05 DIAGNOSIS — Z331 Pregnant state, incidental: Secondary | ICD-10-CM

## 2015-04-05 DIAGNOSIS — Z3A39 39 weeks gestation of pregnancy: Secondary | ICD-10-CM

## 2015-04-05 DIAGNOSIS — Z1389 Encounter for screening for other disorder: Secondary | ICD-10-CM

## 2015-04-05 DIAGNOSIS — Z3403 Encounter for supervision of normal first pregnancy, third trimester: Secondary | ICD-10-CM

## 2015-04-05 LAB — POCT URINALYSIS DIPSTICK
Blood, UA: NEGATIVE
Glucose, UA: NEGATIVE
Ketones, UA: NEGATIVE
Leukocytes, UA: NEGATIVE
Nitrite, UA: NEGATIVE
Protein, UA: NEGATIVE

## 2015-04-05 NOTE — Progress Notes (Signed)
Patient ID: Andrea Stephenson, female   DOB: 04/19/1990, 24 y.o.   MRN: [id number]  G1P0 4146w3d Estimated Date of Delivery: 04/09/15  Blood pressure 114/70, pulse 94, weight 212 lb 3.2 oz (96.253 kg), last menstrual period 06/24/2014.   refer to the ob flow sheet for FH and FHR, also BP, Wt, Urine results: notable for negative   Patient reports  + good fetal movement, denies any bleeding and no rupture of membranes symptoms or regular contractions. Patient complaints: none.  FHR- 140 bpm  FH- 37 cm  Fetal position- vertex  Cervix- long and os   Questions were answered. Assessment: LROB G1P0 @ 6746w3d  Fetal position vertex  Cervix is long and os   Plan:  Continued routine obstetrical care  F/u in 1 weeks for routine prenatal care    By signing my name below, I, Doreatha MartinEva Mathews, attest that this documentation has been prepared under the direction and in the presence of Tilda BurrowJohn Dorethea Strubel V, MD. Electronically Signed: Doreatha MartinEva Mathews, ED Scribe. 04/05/2015. 4:16 PM.  I personally performed the services described in this documentation, which was SCRIBED in my presence. The recorded information has been reviewed and considered accurate. It has been edited as necessary during review. Tilda BurrowFERGUSON,Ezme Duch V, MD

## 2015-04-08 NOTE — L&D Delivery Note (Signed)
Patient is 25 y.o. G1P0 262w1d admitted for postdates induction of labor. Hx of Tobacco use in pregnancy. She was given 1 dose of Cytotec and progressed to 3/80/-2. She was started on pitocin and progressed to 6.5cm and requested epidural placement. FHR tracing was category 1 prior to placement. After placement, there was fetal bradycardia (~1608). FHR was in the 60-70s and I was called to the room.  I evaluated the patient and she was c/c/+2-+3. FHR increased to 120s with moderate variability with changes in position. At 1622 after several pushes the fetal heart rate decreased to 50-60s. FSE was placed at 1625 and FHR had intermittent rebound to 130 at 1628. There was again a fetal bradycardia to <60 and the decision was made to proceed with operative vaginal delivery. Total bradycardia at was 3 minutes prior to delivery of the infant.  NICU was present for delivery.   Indication for operative vaginal delivery:  Risks of vacuum assistance were discussed including but not limited to, bleeding, infection, damage to maternal tissues, fetal cephalohematoma, inability to effect vaginal delivery of the head or shoulder dystocia that cannot be resolved by established maneuvers and need for emergency cesarean section.  Patient gave verbal consent.  Patient was examined and found to be fully dilated with fetal station of +3. The Kiwi vacuum extractor was positioned over the sagittal suture 3 cm anterior to posterior fontanelle.  Pressure was then increased to 500 mmHg, and the patient was instructed to push.  Pulling was administered along the pelvic curve.  3  pulls was administered during 3 push, 1 popoffs.  The infant was then delivered atraumatically, noted to be a viable female infant.  Of note patient reported, after delivery that she had leaking fluid 2 days prior to PD-IOL. She is GBS positive and was on appropriate PCN prophylaxis. There was no noted maternal, fetal tachycardia or maternal fever throughout  her labor course.   Delivery Note At 4:31 PM a viable female was delivered via Vaginal, Vacuum (Extractor) (Presentation: OA).  APGAR: 1, 5; weight - pending.   Placenta status: Intact, Spontaneous. -sent to pathology. Cord: 3 vessels with the following complications: Cord pH (venous): 7.21 PCO2 34.2  Acid base deficit 14.1.  Placenta was noted to have a marginal insertion and somewhat short umbilical cord that measured 55 cm.  Anesthesia:  Epidural  Episiotomy:  none Lacerations:  1st degree perineal (bilateral disruptions of hymenal ring at 5 o'clock and 8 o'clock), Right periurethral laceration.  Suture Repair: 3.0 vicryl- 1st degree. 4.0 vicryl- right periurethral laceration Est. Blood Loss (mL):  250  Mom to postpartum.  Baby to Couplet care / Skin to Skin.  Isa RankinKimberly Niles [facility] 04/17/2015, 5:30 PM

## 2015-04-12 ENCOUNTER — Encounter: Payer: Self-pay | Admitting: Obstetrics and Gynecology

## 2015-04-12 ENCOUNTER — Ambulatory Visit (INDEPENDENT_AMBULATORY_CARE_PROVIDER_SITE_OTHER): Payer: Medicaid Other | Admitting: Obstetrics and Gynecology

## 2015-04-12 VITALS — BP 120/76 | HR 88 | Wt 213.0 lb

## 2015-04-12 DIAGNOSIS — O480 Post-term pregnancy: Secondary | ICD-10-CM | POA: Diagnosis not present

## 2015-04-12 DIAGNOSIS — Z331 Pregnant state, incidental: Secondary | ICD-10-CM

## 2015-04-12 DIAGNOSIS — Z3403 Encounter for supervision of normal first pregnancy, third trimester: Secondary | ICD-10-CM

## 2015-04-12 DIAGNOSIS — Z3A40 40 weeks gestation of pregnancy: Secondary | ICD-10-CM | POA: Diagnosis not present

## 2015-04-12 DIAGNOSIS — Z1389 Encounter for screening for other disorder: Secondary | ICD-10-CM

## 2015-04-12 LAB — POCT URINALYSIS DIPSTICK
Glucose, UA: NEGATIVE
Ketones, UA: NEGATIVE
Leukocytes, UA: NEGATIVE
NITRITE UA: NEGATIVE
PROTEIN UA: NEGATIVE
RBC UA: NEGATIVE

## 2015-04-12 NOTE — Progress Notes (Signed)
Patient ID: Andrea PeerJulia Wyne, female   DOB: 02/15/1991, 25 y.o.   MRN: [id number]  G1P0 3175w3d Estimated Date of Delivery: 04/09/15  Blood pressure 120/76, pulse 88, weight 213 lb (96.616 kg), last menstrual period 06/24/2014.   refer to the ob flow sheet for FH and FHR, also BP, Wt, Urine results:notable for negative  Patient reports  + good fetal movement, denies any bleeding and no rupture of membranes symptoms or regular contractions. Patient complaints: None.  FHR: 146 bpm FH: 38 cm Cervix: 2 cm   Questions were answered. Assessment: LROB G1P0 @ 8875w3d   Plan:  Continued routine obstetrical care, Scheduled IOL for Tuesday at 7283w1d 6:30 am  Induction scheduled for Tuesday if not yet delivered  F/u in 4 weeks fopostpartum visit  By signing my name below, I, Ronney LionSuzanne Le, attest that this documentation has been prepared under the direction and in the presence of Tilda BurrowJohn Bhavana Kady V, MD. Electronically Signed: Ronney LionSuzanne Le, ED Scribe. 04/12/2015. 12:19 PM.  I personally performed the services described in this documentation, which was SCRIBED in my presence. The recorded information has been reviewed and considered accurate. It has been edited as necessary during review. Tilda BurrowFERGUSON,Bettejane Leavens V, MD  (scribe attestation statement)

## 2015-04-12 NOTE — Progress Notes (Signed)
Pt denies any problems or concerns at this time.  

## 2015-04-13 ENCOUNTER — Telehealth (HOSPITAL_COMMUNITY): Payer: Self-pay | Admitting: *Deleted

## 2015-04-13 ENCOUNTER — Encounter (HOSPITAL_COMMUNITY): Payer: Self-pay | Admitting: *Deleted

## 2015-04-13 NOTE — Telephone Encounter (Signed)
Preadmission screen  

## 2015-04-17 ENCOUNTER — Inpatient Hospital Stay (HOSPITAL_COMMUNITY): Payer: Medicaid Other | Admitting: Anesthesiology

## 2015-04-17 ENCOUNTER — Inpatient Hospital Stay (HOSPITAL_COMMUNITY)
Admission: RE | Admit: 2015-04-17 | Discharge: 2015-04-19 | DRG: 775 | Disposition: A | Discharge status: Home / Self Care | Payer: Medicaid Other | Source: Ambulatory Visit | Attending: Family Medicine | Admitting: Family Medicine

## 2015-04-17 ENCOUNTER — Encounter (HOSPITAL_COMMUNITY): Payer: Self-pay

## 2015-04-17 VITALS — BP 114/67 | HR 73 | Temp 98.4°F | Resp 18 | Ht 67.0 in | Wt 213.0 lb

## 2015-04-17 DIAGNOSIS — O99334 Smoking (tobacco) complicating childbirth: Secondary | ICD-10-CM | POA: Diagnosis present

## 2015-04-17 DIAGNOSIS — Z3400 Encounter for supervision of normal first pregnancy, unspecified trimester: Secondary | ICD-10-CM

## 2015-04-17 DIAGNOSIS — F17210 Nicotine dependence, cigarettes, uncomplicated: Secondary | ICD-10-CM | POA: Diagnosis present

## 2015-04-17 DIAGNOSIS — O26893 Other specified pregnancy related conditions, third trimester: Secondary | ICD-10-CM

## 2015-04-17 DIAGNOSIS — Z3403 Encounter for supervision of normal first pregnancy, third trimester: Secondary | ICD-10-CM | POA: Diagnosis present

## 2015-04-17 DIAGNOSIS — Z3A41 41 weeks gestation of pregnancy: Secondary | ICD-10-CM

## 2015-04-17 DIAGNOSIS — O480 Post-term pregnancy: Secondary | ICD-10-CM | POA: Diagnosis not present

## 2015-04-17 DIAGNOSIS — O99824 Streptococcus B carrier state complicating childbirth: Secondary | ICD-10-CM | POA: Diagnosis present

## 2015-04-17 DIAGNOSIS — Z6791 Unspecified blood type, Rh negative: Secondary | ICD-10-CM | POA: Diagnosis not present

## 2015-04-17 HISTORY — DX: Anxiety disorder, unspecified: F41.9

## 2015-04-17 LAB — TYPE AND SCREEN
ABO/RH(D): A NEG
Antibody Screen: NEGATIVE

## 2015-04-17 LAB — CBC
HEMATOCRIT: 32.9 % — AB (ref 36.0–46.0)
Hemoglobin: 11.1 g/dL — ABNORMAL LOW (ref 12.0–15.0)
MCH: 27.5 pg (ref 26.0–34.0)
MCHC: 33.7 g/dL (ref 30.0–36.0)
MCV: 81.4 fL (ref 78.0–100.0)
PLATELETS: 189 10*3/uL (ref 150–400)
RBC: 4.04 MIL/uL (ref 3.87–5.11)
RDW: 13.4 % (ref 11.5–15.5)
WBC: 13.7 10*3/uL — AB (ref 4.0–10.5)

## 2015-04-17 LAB — RPR: RPR Ser Ql: NONREACTIVE

## 2015-04-17 LAB — ABO/RH: ABO/RH(D): A NEG

## 2015-04-17 MED ORDER — DIPHENHYDRAMINE HCL 25 MG PO CAPS: 25.0000 mg | ORAL_CAPSULE | Freq: Four times a day (QID) | ORAL | Status: DC | PRN

## 2015-04-17 MED ORDER — ONDANSETRON HCL 4 MG/2ML IJ SOLN: 4.0000 mg | INTRAMUSCULAR | Status: DC | PRN

## 2015-04-17 MED ORDER — BENZOCAINE-MENTHOL 20-0.5 % EX AERO
1.0000 "application " | INHALATION_SPRAY | CUTANEOUS | Status: DC | PRN
  Administered 2015-04-17: 1 via TOPICAL
  Filled 2015-04-17: qty 56

## 2015-04-17 MED ORDER — PHENYLEPHRINE 40 MCG/ML (10ML) SYRINGE FOR IV PUSH (FOR BLOOD PRESSURE SUPPORT)
80.0000 ug | PREFILLED_SYRINGE | INTRAVENOUS | Status: DC | PRN
  Filled 2015-04-17: qty 2
  Filled 2015-04-17: qty 20

## 2015-04-17 MED ORDER — ONDANSETRON HCL 4 MG PO TABS: 4.0000 mg | ORAL_TABLET | ORAL | Status: DC | PRN

## 2015-04-17 MED ORDER — EPHEDRINE 5 MG/ML INJ
10.0000 mg | INTRAVENOUS | Status: DC | PRN
  Filled 2015-04-17: qty 2

## 2015-04-17 MED ORDER — IBUPROFEN 600 MG PO TABS
600.0000 mg | ORAL_TABLET | Freq: Four times a day (QID) | ORAL | Status: DC
  Administered 2015-04-17 – 2015-04-19 (×9): 600 mg via ORAL
  Filled 2015-04-17 (×9): qty 1

## 2015-04-17 MED ORDER — ACETAMINOPHEN 325 MG PO TABS: 650.0000 mg | ORAL_TABLET | ORAL | Status: DC | PRN

## 2015-04-17 MED ORDER — OXYCODONE-ACETAMINOPHEN 5-325 MG PO TABS: 1.0000 | ORAL_TABLET | ORAL | Status: DC | PRN

## 2015-04-17 MED ORDER — OXYTOCIN 10 UNIT/ML IJ SOLN: 2.5000 [IU]/h | INTRAMUSCULAR | Status: DC

## 2015-04-17 MED ORDER — DOCUSATE SODIUM 100 MG PO CAPS
100.0000 mg | ORAL_CAPSULE | Freq: Two times a day (BID) | ORAL | Status: DC
  Administered 2015-04-17 – 2015-04-19 (×4): 100 mg via ORAL
  Filled 2015-04-17 (×4): qty 1

## 2015-04-17 MED ORDER — DIPHENHYDRAMINE HCL 50 MG/ML IJ SOLN: 12.5000 mg | INTRAMUSCULAR | Status: DC | PRN

## 2015-04-17 MED ORDER — DIBUCAINE 1 % RE OINT: 1.0000 "application " | TOPICAL_OINTMENT | RECTAL | Status: DC | PRN

## 2015-04-17 MED ORDER — MISOPROSTOL 25 MCG QUARTER TABLET
25.0000 ug | ORAL_TABLET | ORAL | Status: DC | PRN
  Administered 2015-04-17: 25 ug via VAGINAL
  Filled 2015-04-17: qty 1
  Filled 2015-04-17: qty 0.25

## 2015-04-17 MED ORDER — LANOLIN HYDROUS EX OINT: TOPICAL_OINTMENT | CUTANEOUS | Status: DC | PRN

## 2015-04-17 MED ORDER — ACETAMINOPHEN 325 MG PO TABS
650.0000 mg | ORAL_TABLET | ORAL | Status: DC | PRN
  Administered 2015-04-18: 650 mg via ORAL
  Filled 2015-04-17: qty 2

## 2015-04-17 MED ORDER — OXYTOCIN BOLUS FROM INFUSION
500.0000 mL | INTRAVENOUS | Status: DC
  Administered 2015-04-17: 500 mL via INTRAVENOUS

## 2015-04-17 MED ORDER — SIMETHICONE 80 MG PO CHEW: 80.0000 mg | CHEWABLE_TABLET | ORAL | Status: DC | PRN

## 2015-04-17 MED ORDER — LACTATED RINGERS IV SOLN
INTRAVENOUS | Status: DC
  Administered 2015-04-17 (×2): via INTRAVENOUS

## 2015-04-17 MED ORDER — PENICILLIN G POTASSIUM 5000000 UNITS IJ SOLR
2.5000 10*6.[IU] | INTRAVENOUS | Status: DC
  Administered 2015-04-17: 2.5 10*6.[IU] via INTRAVENOUS
  Filled 2015-04-17 (×5): qty 2.5

## 2015-04-17 MED ORDER — TERBUTALINE SULFATE 1 MG/ML IJ SOLN: 0.2500 mg | Freq: Once | INTRAMUSCULAR | Status: DC | PRN

## 2015-04-17 MED ORDER — OXYCODONE-ACETAMINOPHEN 5-325 MG PO TABS: 2.0000 | ORAL_TABLET | ORAL | Status: DC | PRN

## 2015-04-17 MED ORDER — CITRIC ACID-SODIUM CITRATE 334-500 MG/5ML PO SOLN
30.0000 mL | ORAL | Status: DC | PRN
  Filled 2015-04-17: qty 15

## 2015-04-17 MED ORDER — LIDOCAINE HCL (PF) 1 % IJ SOLN
30.0000 mL | INTRAMUSCULAR | Status: AC | PRN
  Administered 2015-04-17: 30 mL via SUBCUTANEOUS
  Filled 2015-04-17: qty 30

## 2015-04-17 MED ORDER — LACTATED RINGERS IV SOLN
1.0000 m[IU]/min | INTRAVENOUS | Status: DC
  Administered 2015-04-17: 2 m[IU]/min via INTRAVENOUS
  Filled 2015-04-17: qty 4

## 2015-04-17 MED ORDER — FENTANYL 2.5 MCG/ML BUPIVACAINE 1/10 % EPIDURAL INFUSION (WH - ANES)
14.0000 mL/h | INTRAMUSCULAR | Status: DC | PRN
  Administered 2015-04-17: 14 mL/h via EPIDURAL
  Filled 2015-04-17: qty 125

## 2015-04-17 MED ORDER — PRENATAL MULTIVITAMIN CH
1.0000 | ORAL_TABLET | Freq: Every day | ORAL | Status: DC
  Administered 2015-04-18 – 2015-04-19 (×2): 1 via ORAL
  Filled 2015-04-17 (×2): qty 1

## 2015-04-17 MED ORDER — PENICILLIN G POTASSIUM 5000000 UNITS IJ SOLR
5.0000 10*6.[IU] | Freq: Once | INTRAVENOUS | Status: AC
  Administered 2015-04-17: 5 10*6.[IU] via INTRAVENOUS
  Filled 2015-04-17: qty 5

## 2015-04-17 MED ORDER — FENTANYL CITRATE (PF) 100 MCG/2ML IJ SOLN
100.0000 ug | INTRAMUSCULAR | Status: DC | PRN
  Administered 2015-04-17: 100 ug via INTRAVENOUS
  Filled 2015-04-17: qty 2

## 2015-04-17 MED ORDER — FLEET ENEMA 7-19 GM/118ML RE ENEM: 1.0000 | ENEMA | Freq: Every day | RECTAL | Status: DC | PRN

## 2015-04-17 MED ORDER — LACTATED RINGERS IV SOLN
500.0000 mL | INTRAVENOUS | Status: DC | PRN
  Administered 2015-04-17: 1000 mL via INTRAVENOUS
  Administered 2015-04-17: 500 mL via INTRAVENOUS

## 2015-04-17 MED ORDER — LIDOCAINE HCL (PF) 1 % IJ SOLN
INTRAMUSCULAR | Status: DC | PRN
  Administered 2015-04-17: 4 mL
  Administered 2015-04-17: 6 mL via EPIDURAL

## 2015-04-17 MED ORDER — WITCH HAZEL-GLYCERIN EX PADS: 1.0000 "application " | MEDICATED_PAD | CUTANEOUS | Status: DC | PRN

## 2015-04-17 MED ORDER — ONDANSETRON HCL 4 MG/2ML IJ SOLN: 4.0000 mg | Freq: Four times a day (QID) | INTRAMUSCULAR | Status: DC | PRN

## 2015-04-17 NOTE — Anesthesia Preprocedure Evaluation (Signed)

## 2015-04-17 NOTE — H&P (Signed)
LABOR ADMISSION HISTORY AND PHYSICAL  Andrea Stephenson is a 25 y.o. female G1P0 with IUP at [redacted]w[redacted]d by 6.6wk [place] presenting for IOL due to postdates. She reports +FM, + contractions, no VB, no blurry vision, headaches, and RUQ pain. Notes of leakage of clear fluid for a day. Notes of bilateral LE swelling that is not worsening. She plans on breast feeding. She undecided on type of contraception.  Pregnancy complicated by: 1) Tobacco use   Dating: By 6.6wk [place] --->  Estimated Date of Delivery: 04/09/15  Sono:   @[redacted]w[redacted]d , normal anatomy, breech presentation,  308g, consistent with dating   Prenatal History/Complications: Tobacco use during pregnancy: 1-10 cigarettes a day during pregnancy. Prior to pregnancy 1PPD for 7 years.   Past Medical History: Past Medical History  Diagnosis Date  . History of ADHD 10/06/2012  . Anxiety     Past Surgical History: Past Surgical History  Procedure Laterality Date  . No past surgeries      Obstetrical History: OB History    Gravida Para Term Preterm AB TAB SAB Ectopic Multiple Living   1               Social History: Social History   Social History  . Marital Status: Single    Spouse Name: N/A  . Number of Children: N/A  . Years of Education: N/A   Social History Main Topics  . Smoking status: Current Some Day Smoker -- 0.50 packs/day    Types: Cigarettes  . Smokeless tobacco: None  . Alcohol Use: No  . Drug Use: No  . Sexual Activity: Not Currently   Other Topics Concern  . None   Social History Narrative    Family History: Family History  Problem Relation Age of Onset  . Diabetes Mother   . Hypertension Mother     Allergies: Allergies  Allergen Reactions  . Other Shortness Of Breath    Pecans  . Latex Itching    Latex     Prescriptions prior to admission  Medication Sig Dispense Refill Last Dose  . calcium carbonate (TUMS - DOSED IN MG ELEMENTAL CALCIUM) 500 MG chewable tablet Chew 1 tablet by mouth as needed for  indigestion or heartburn.   04/16/2015 at Unknown time  . Prenatal Vit-Fe Fumarate-FA (MULTIVITAMIN-PRENATAL) 27-0.8 MG TABS tablet Take 1 tablet by mouth daily at 12 noon.   Past Week at Unknown time     Review of Systems   All systems reviewed and negative except as stated in HPI  BP 129/84 mmHg  Pulse 113  Temp(Src) 98.6 F (37 C) (Oral)  Resp 20  Ht 5\' 7"  (1.702 m)  Wt 96.616 kg (213 lb)  BMI 33.35 kg/m2  LMP 06/24/2014 General appearance: alert and cooperative Lungs: clear to auscultation bilaterally Heart: regular rate and rhythm Abdomen: soft, non-tender; bowel sounds normal Extremities: Homans sign is negative, no sign of DVT, edema Fetal monitoringBaseline: 135 bpm, Variability: Good {> 6 bpm), Accelerations: Reactive and Decelerations: Absent Uterine activity: Every 3-6 mins.   Dilation: 2 Effacement (%): Thick Station: -2 Exam by:: Marcelino Duster, RN    Prenatal labs: ABO, Rh: A/Negative/-- (05/25 1609) Antibody: Negative (10/05 0935) Rubella: Immune RPR: Non Reactive (10/05 0935)  HBsAg: Negative (05/25 1609)  HIV: Non Reactive (10/05 0935)  GBS: Positive (12/15 1400)  2 hr Glucola: fasting 80, 1hr 161, 2 hr 144 Genetic screening: NT/IT neg Anatomy US nml  Prenatal Transfer Tool  Maternal Diabetes: No Genetic Screening: Normal Maternal Ultrasounds/Referrals: Normal  Fetal Ultrasounds or other Referrals:  None Maternal Substance Abuse:  Tobacco use during pregnancy  Significant Maternal Medications:  None Significant Maternal Lab Results: Lab values include: Group B Strep positive  No results found for this or any previous visit (from the past 24 hour(s)).  Patient Active Problem List   Diagnosis Date Noted  . Pregnancy 04/17/2015  . Rh negative state in antepartum period 12/13/2014  . Supervision of normal first pregnancy 08/30/2014  . History of ADHD 10/06/2012    Assessment: Andrea Stephenson is a 25 y.o. G1P0 at 5541w1d here for IOL due to postdates    #Labor: IOL with Cytotec #Pain: Pt desires Epidural  #FWB: Cat 1 #ID: GBS positive- PCN #MOF: breast  #MOC: undecided  #Circ: N/A   Palma HolterKanishka G Gunadasa, MD PGY 1 Family Medicine   OB fellow attestation: I have seen and examined this patient; I agree with above documentation in the resident's note.   Andrea Stephenson is a 25 y.o. G1P0 here for postdates induction of labor  PE: BP 129/93 mmHg  Pulse 98  Temp(Src) 98.2 F (36.8 C) (Oral)  Resp 18  Ht 5\' 7"  (1.702 m)  Wt 213 lb (96.616 kg)  BMI 33.35 kg/m2  SpO2 99%  LMP 06/24/2014 Gen: calm comfortable, NAD Resp: normal effort, no distress Abd: gravid  ROS, labs, PMH reviewed  Plan: #Labor: IOL with Cytotec. Pitocin when favorable #Pain: Pt desires Epidural  #FWB: Cat 1 #ID: GBS positive- PCN #MOF: breast  #MOC: undecided  #Circ: N/A   Federico FlakeKimberly Niles Leslea Vowles, MD  Family Medicine, OB Fellow 04/17/2015, 3:42 PM

## 2015-04-17 NOTE — Consult Note (Signed)
[facility]  Delivery Note:  SVD       04/17/2015  5:44 PM  I was called to the delivery room at the request of the patient's obstetrician (Dr. Adrian BlackwaterStinson) for fetal distress and vacuum delivery.    PRENATAL HX:  This is a 25 y/o G1P0 at 6741 and 1/7 weeks who was admitted this morning for IOL.  She was GBS + but got adequate treatment.  Her pregnancy was uncomplicated, though there is some question of ROM about 48 hours ago, as she reports some leakage of fluid over that time  DELIVERY:  The infant was apneic and floppy at delivery with HR < 100.  There was no response to standard warming, drying and stimulation, so PPV was initiated at 40 seconds of age.  HR improved to >100 and infant began to breath spontaneously, though breathing was labored with gasping and grunting.  Tone and response only improved marginally by 15 minutes.  Oxygen saturations were in the 90s from 5 minutes on without any supplemental oxygen.  APGARs 1 and 5, and 7.  Exam notable for pallor, poor tone, retractions and grunting.  Infant admitted to NICU for post resuscitation care, and evaluation of acid-base status, and a screening CBC for infection.    _____________________ Electronically Signed By: Maryan CharLindsey Clinton Wahlberg, MD Neonatologist

## 2015-04-17 NOTE — Progress Notes (Signed)
Labor Progress Note Andrea PeerJulia Stephenson is a 25 y.o. G1P0 at 5199w1d presented for PD-IOL  S: having regular ctx, painful and able to breath through them  O:  BP 129/84 mmHg  Pulse 113  Temp(Src) 98.6 F (37 C) (Oral)  Resp 20  Ht 5\' 7"  (1.702 m)  Wt 213 lb (96.616 kg)  BMI 33.35 kg/m2  LMP 06/24/2014 EFM: 135/mod/+accels, decels  CVE: Dilation: 3 Effacement (%): 80 Cervical Position: Posterior Station: -2 Presentation: Vertex Exam by:: dr. Alvester MorinNewton   A&P: 25 y.o. G1P0 8699w1d  PD- IOL #Labor: Progressing well. Start pitocin given bishop of 8 #Pain: prn meds if wanted #FWB: Cat1 #GBS Neg  Andrea FlakeKimberly Niles [place], MD 12:35 PM

## 2015-04-17 NOTE — Anesthesia Procedure Notes (Signed)

## 2015-04-18 MED ORDER — RHO D IMMUNE GLOBULIN 1500 UNIT/2ML IJ SOSY
300.0000 ug | PREFILLED_SYRINGE | Freq: Once | INTRAMUSCULAR | Status: AC
  Administered 2015-04-18: 300 ug via INTRAMUSCULAR
  Filled 2015-04-18: qty 2

## 2015-04-18 NOTE — Progress Notes (Signed)
Post Partum Day 1 Subjective: no complaints, up ad lib and voiding  Objective: Blood pressure 101/59, pulse 78, temperature 98.2 F (36.8 C), temperature source Oral, resp. rate 18, height 5\' 7"  (1.702 m), weight 96.616 kg (213 lb), last menstrual period 06/24/2014, SpO2 99 %, unknown if currently breastfeeding.  Physical Exam:  General: alert, cooperative and no distress Lochia: appropriate Uterine Fundus: firm Incision: n/a DVT Evaluation: No evidence of DVT seen on physical exam.   Recent Labs  04/17/15 0907  HGB 11.1*  HCT 32.9*    Assessment/Plan: Breastfeeding Baby in NICU doing well per patient report  LOS: 1 day   Zyler Hyson 04/18/2015, 8:11 AM

## 2015-04-18 NOTE — Lactation Note (Signed)
This note was copied from the chart of Andrea Leslye PeerJulia Sahakian. Lactation Consultation Note  Initial visit made. Mom has Providing Breastmilk for Your Baby in NICU.  Mom has pumped twice since birth.  She states no milk obtained.  Instructed on hand expression to follow pumping.  Discussed milk coming to volume.  Mom does not have a double electric pump at home but [facility]  [facility] she has a baby in [facility]ICU.  Also made aware of 2 week rental.  Encouraged to call with concerns prn.  Patient Name: Andrea Stephenson Today's Date: 04/18/2015 Reason for consult: Initial assessment;NICU baby   Maternal Data Has patient been taught Hand Expression?: Yes Does the patient have breastfeeding experience prior to this delivery?: No  Feeding Feeding Type: Formula Nipple Type: Slow - flow Length of feed: 15 min  LATCH Score/Interventions                      Lactation Tools Discussed/Used WIC Program: Yes Pump Review: Setup, frequency, and cleaning;Milk Storage Initiated by:: RN Date initiated:: 04/17/15   Consult Status Consult Status: Follow-up Date: 04/19/15 Follow-up type: In-patient    Huston FoleyMOULDEN, [name] 04/18/2015, 9:59 AM

## 2015-04-18 NOTE — Anesthesia Postprocedure Evaluation (Signed)
Anesthesia Post Note  Patient: Andrea PeerJulia Stephenson  Procedure(s) Performed: * No procedures listed *  Patient location during evaluation: [facility] Unit Anesthesia Type: Epidural Level of consciousness: awake and alert Pain management: pain level controlled Vital Signs Assessment: post-procedure vital signs reviewed and stable Respiratory status: spontaneous breathing Cardiovascular status: stable Postop Assessment: no headache, no backache, no signs of nausea or vomiting and adequate PO intake Anesthetic complications: no    Last Vitals:  Filed Vitals:   04/17/15 2336 04/18/15 0527  BP: 120/61 101/59  Pulse: 94 78  Temp: 37.2 C 36.8 C  Resp: 18 18    Last Pain:  Filed Vitals:   04/18/15 0702  PainSc: Asleep                 Leola Fiore Hristova

## 2015-04-19 LAB — RH IG WORKUP (INCLUDES ABO/RH)
ABO/RH(D): A NEG
Fetal Screen: NEGATIVE
Gestational Age(Wks): 41
UNIT DIVISION: 0

## 2015-04-19 MED ORDER — IBUPROFEN 600 MG PO TABS: 600.0000 mg | ORAL_TABLET | Freq: Four times a day (QID) | ORAL | Status: DC

## 2015-04-19 MED ORDER — DOCUSATE SODIUM 100 MG PO CAPS: 100.0000 mg | ORAL_CAPSULE | Freq: Two times a day (BID) | ORAL | Status: DC

## 2015-04-19 NOTE — Discharge Summary (Signed)
OB Discharge Summary     Patient Name: Andrea Stephenson DOB: [birth year] MRN: [id number]  Date of admission: 04/17/2015 Delivering MD: Federico Flake   Date of discharge: 04/19/2015  Admitting diagnosis: INDUCTION postdates Intrauterine pregnancy: [redacted]w[redacted]d     Secondary diagnosis:  Active Problems:   Pregnancy fetal low initial apgars, 1/5, required NICU care x 48 hours. Additional problems:      Discharge diagnosis: Term Pregnancy Delivered and postdates pregnancy                                                                                                Post partum procedures:none  Augmentation: AROM, Pitocin, Cytotec and Foley Balloon  Complications: prolonged second stage, required Vacuum assist delivery  Patient is 25 y.o. G1P0 [redacted]w[redacted]d admitted for postdates induction of labor. Hx of Tobacco use in pregnancy. She was given 1 dose of Cytotec and progressed to 3/80/-2. She was started on pitocin and progressed to 6.5cm and requested epidural placement. FHR tracing was category 1 prior to placement. After placement, there was fetal bradycardia (~1608). FHR was in the 60-70s and I was called to the room. I evaluated the patient and she was c/c/+2-+3. FHR increased to 120s with moderate variability with changes in position. At 1622 after several pushes the fetal heart rate decreased to 50-60s. FSE was placed at 1625 and FHR had intermittent rebound to 130 at 1628. There was again a fetal bradycardia to <60 and the decision was made to proceed with operative vaginal delivery. Total bradycardia at was 3 minutes prior to delivery of the infant. NICU was present for delivery.   Indication for operative vaginal delivery:  Risks of vacuum assistance were discussed including but not limited to, bleeding, infection, damage to maternal tissues, fetal cephalohematoma, inability to effect vaginal delivery of the head or shoulder dystocia that cannot be resolved by established maneuvers and need  for emergency cesarean section. Patient gave verbal consent.  Patient was examined and found to be fully dilated with fetal station of +3. The Kiwi vacuum extractor was positioned over the sagittal suture 3 cm anterior to posterior fontanelle. Pressure was then increased to 500 mmHg, and the patient was instructed to push. Pulling was administered along the pelvic curve. 3 pulls was administered during 3 push, 1 popoffs. The infant was then delivered atraumatically, noted to be a viable female infant.  Of note patient reported, after delivery that she had leaking fluid 2 days prior to PD-IOL. She is GBS positive and was on appropriate PCN prophylaxis. There was no noted maternal, fetal tachycardia or maternal fever throughout her labor course.   Delivery Note At 4:31 PM a viable female was delivered via Vaginal, Vacuum (Extractor) (Presentation: OA). APGAR: 1, 5; weight - pending.  Placenta status: Intact, Spontaneous. -sent to pathology. Cord: 3 vessels with the following complications: Cord pH (venous): 7.21 PCO2 34.2 Acid base deficit 14.1. Placenta was noted to have a marginal insertion and somewhat short umbilical cord that measured 55 cm.  Anesthesia: Epidural  Episiotomy: none Lacerations: 1st degree perineal (bilateral disruptions of hymenal ring at 5 o'clock  and 8 o'clock), Right periurethral laceration.  Suture Repair: 3.0 vicryl- 1st degree. 4.0 vicryl- right periurethral laceration Est. Blood Loss (mL): 250  Mom to postpartum. Baby to Couplet care / Skin to Skin.  Isa RankinKimberly Niles [facility] 04/17/2015, 5:30 PM Hospital course:  Induction of Labor With Vaginal Delivery   25 y.o. yo G1P1001 at 7259w1d was admitted to the hospital 04/17/2015 for induction of labor.  Indication for induction: Postdates.  Patient had an uncomplicated labor course as follows: Membrane Rupture Time/Date:   ,    Intrapartum Procedures: Episiotomy: None [1]                                          Lacerations:  2nd degree [3]  Patient had delivery of a Viable infant.  Information for the patient's newborn:  Jon GillsMills, Girl Kaile [[id number]][[id number]]  Delivery Method: Vaginal, Vacuum (Extractor) (Filed from Delivery Summary)   04/17/2015  Details of delivery can be found in separate delivery note.  Patient had a routine postpartum course. Patient is discharged home 04/19/2015.   Physical exam  Filed Vitals:   04/18/15 1515 04/18/15 1759 04/18/15 2125 04/19/15 0534  BP: 121/60 140/67 126/71 106/51  Pulse: 94 87 93 89  Temp: 98.1 F (36.7 C) 98.2 F (36.8 C) 98.3 F (36.8 C) 97.8 F (36.6 C)  TempSrc: Oral Oral Oral Oral  Resp: 18 18 18 18   Height:      Weight:      SpO2: 97% 98% 99% 100%   General: alert, cooperative and no distress Lochia: appropriate Uterine Fundus: firm Incision:  DVT Evaluation: No evidence of DVT seen on physical exam. Labs: Lab Results  Component Value Date   WBC 13.7* 04/17/2015   HGB 11.1* 04/17/2015   HCT 32.9* 04/17/2015   MCV 81.4 04/17/2015   PLT 189 04/17/2015   No flowsheet data found.  Discharge instruction: per After Visit Summary and "Baby and Me Booklet".  After visit meds:    Medication List    TAKE these medications        calcium carbonate 500 MG chewable tablet  Commonly known as:  TUMS - dosed in mg elemental calcium  Chew 1 tablet by mouth as needed for indigestion or heartburn.     docusate sodium 100 MG capsule  Commonly known as:  COLACE  Take 1 capsule (100 mg total) by mouth 2 (two) times daily.     ibuprofen 600 MG tablet  Commonly known as:  ADVIL,MOTRIN  Take 1 tablet (600 mg total) by mouth every 6 (six) hours.     multivitamin-prenatal 27-0.8 MG Tabs tablet  Take 1 tablet by mouth daily at 12 noon.        Diet: routine diet  Activity: Advance as tolerated. Pelvic rest for 6 weeks.   Outpatient follow up:6 weeks Follow up Appt:Future Appointments Date Time Provider Department Center  05/17/2015 9:00 AM  Jacklyn ShellFrances Cresenzo-Dishmon, CNM [facility]   Follow up Visit:No Follow-up on file.  Postpartum contraception: Undecided counselled, leaning toward ocp's  Newborn Data: Live born female  Birth Weight: 6 lb 6.3 oz (2900 g) APGAR: 1, 5  Baby Feeding: Bottle and Breast Disposition:NICU   04/19/2015 Tilda BurrowFERGUSON,Kendrew Paci V, MD

## 2015-04-19 NOTE — Lactation Note (Signed)
This note was copied from the chart of Andrea Stephenson Mccully. Lactation Consultation Note  Mom states she is pumping but not obtaining milk.  She states she has not called WIC.  [facility] and reminded of 2 week pump rental if desired.  Mom states baby may be able to go home.  Encouraged to call with concerns.  Patient Name: Andrea Stephenson Bleau ZOXWR'UToday's Date: 04/19/2015     Maternal Data    Feeding Feeding Type: Formula Nipple Type: Slow - flow Length of feed: 20 min  LATCH Score/Interventions                      Lactation Tools Discussed/Used     Consult Status      Huston FoleyMOULDEN, Brooke Payes S 04/19/2015, 2:15 PM

## 2015-04-19 NOTE — Clinical Social Work Maternal (Signed)
CLINICAL SOCIAL WORK MATERNAL/CHILD NOTE  Patient Details  Name: Andrea Stephenson MRN: [id number] Date of Birth: 09/10/1990  Date:  2015-07-14  Clinical Social Worker Initiating Note:  Garrette Caine E. Brigitte Pulse, [place] Date/ Time Initiated:  04/19/15/1030     Child's Name:  Andrea Stephenson   Legal Guardian:  Mother   Need for Interpreter:  None   Date of Referral:        Reason for Referral:   (No referral-NICU admission)   Referral Source:      Address:   ([address] [place], [place] [address])  Phone number:  [phone]   Household Members:   (MOB reports that she lives alone.)   Natural Supports (not living in the home):  Immediate Family (MOB reports that her mother and three sisters, ages 55, 32 and 2 are her main support people.)   Professional Supports: None   Employment:     Type of Work:  (MOB reports that she is unemployed)   Education:      Pensions consultant:  Medicaid   Other Resources:  Physicist, medical , [employer], [employer]    Cultural/Religious Considerations Which May Impact Care: None stated.  MOB's facesheet states religion as Psychologist, forensic.  Strengths:  Ability to meet basic needs , Pediatrician chosen , Home prepared for child , Compliance with medical plan , Understanding of illness (MOB states that her mother helps her financially.  Pediatric follow up will be at [facility].  )   Risk Factors/Current Problems:  None (MOB's chart states a history of Anxiety, but MOB denies.)   Cognitive State:  Alert , Linear Thinking , Goal Oriented , Insightful    Mood/Affect:  Interested , Relaxed , Calm , Comfortable    CSW Assessment: CSW met with MOB in her third floor room/311 to introduce services, offer support, and complete assessment due to baby's admission to NICU at 41.1 weeks.  MOB was pleasant and welcoming of CSW's visit and states this was a good time to talk with her.  She reports that she is "ok" but reports sadness at the possibility of  leaving baby in the hospital at her discharge today.  She states she thinks this is probably how every mother in this situation feels.  CSW validated and normalized her feelings and provided supportive brief counseling as MOB began to process her emotions.  CSW encouraged her to focus on her baby, rather than her surroundings, and that this is a temporary, but necessary situation.  MOB was understanding.  She reports no issues with transportation if baby stays in the hospital longer than she does.  She states her sister is coming back for her today and will drive her back and forth until she feels comfortable driving post delivery.  She reports that she has her own car.  CSW offered a gas card to assist with transportation and MOB was appreciative.  CSW will provide one $10 card and an additional one if baby is here more than a week from now.   CSW inquired about MOB's supports and preparations for baby at home.  MOB reports that her 77 year old sister is a good support for her and has been with her in the hospital.  She states that her mother is supportive as well as her 19 and 48 year old sisters.  She reports that she is in a relationship with FOB, but that he is not available to be with her because he is in jail until Mid-April.  She reports that he violated his probation and "didn't take care of business like he should have" from something that happened a long time ago.  CSW asked about the relationship and MOB reports that it is a positive relationship and that he is involved and supportive.  She states that it has been difficult to deal with him being gone and that he was incarcerated on February 06, 2015.  She states they have been a couple for approximately a year.  MOB states she have everything she needs for baby at home and was attentive while CSW discussed SIDS precautions.  MOB stated understanding.   CSW discussed common emotions often experienced in the first few weeks after delivery as well as  provided information regarding perinatal mood disorders.  CSW asked MOB how she felt emotionally throughout her pregnancy and whether she has a history of mental illness prior to pregnancy.  MOB states that she had some "small breakdowns" during pregnancy, mostly surrounding FOB's incarceration.  She states that she felt "pretty good most of the time."  She reports no history of mental illness and states no current emotional concerns.  She was attentive to information given about perinatal mood disorders and states she feels comfortable calling her doctor if she has concerns at any time.  CSW offered for her to call CSW even after baby's discharge if she has concerns about her mental health.  MOB seemed appreciative. CSW explained ongoing support services offered by NICU CSW and gave contact information.  MOB asked if it has been determined that baby will definitely not go home with her today.  CSW encouraged her to speak with baby's bedside RN for an update.  CSW has no social concerns at this time and identifies no barriers to discharge when infant is medically ready.  Gas card left at baby's bedside.  CSW Plan/Description:  Engineer, mining , Information/Referral to [employer] , Psychosocial Support and Ongoing Assessment of Needs    Alphonzo Cruise, [place] 04/19/2015, 11:19 AM

## 2015-04-19 NOTE — Progress Notes (Signed)
Discharged, ambulatory to NICU

## 2015-04-19 NOTE — Discharge Instructions (Signed)
Breast Pumping Tips °If you are breastfeeding, there may be times when you cannot feed your baby directly. Returning to work or going on a trip are examples. Pumping allows you to store breast milk and feed it to your baby later.  °You may not get much milk when you first start to pump. Your breasts should start to make more after a few days. If you pump at the times you usually feed your baby, you may be able to keep making enough milk to feed your baby without also using formula. The more often you pump, the more milk your body will make. °WHEN SHOULD I PUMP?  °· You can start to pump soon after you have your baby. Ask your doctor what is right for you and your baby. °· If you are going back to work, start pumping a few weeks before. This gives you time to learn how to pump and to store a supply of milk. °· When you are with your baby, feed your baby when he or she is hungry. Pump after each feeding. °· When you are away from your baby for many hours, pump for about 15 minutes every 2-3 hours. Pump both breasts at the same time if you can. °· If your baby has a formula feeding, make sure to pump close to the same time. °· If you drink any alcohol, wait 2 hours before pumping. °HOW DO I GET READY TO PUMP? °Your let-down reflex is your body's natural reaction that makes your breast milk flow. It is easier to make your breast milk flow when you are relaxed. Try these things to help you relax: °· Smell one of your baby's blankets or an item of clothing. °· Look at a picture or video of your baby. °· Sit in a quiet, private space. °· Massage the breast you plan to pump. °· Place soothing warmth on the breast. °· Play relaxing music. °WHAT ARE SOME BREAST PUMPING TIPS? °· Wash your hands before you pump. You do not need to wash your nipples or breasts. °· There are three ways to pump. You can: °¨ Use your hand to massage and squeeze your breast. °¨ Use a handheld manual pump. °¨ Use an electric pump. °· Make sure the  suction cup on the breast pump is the right size. Place the suction cup directly over the nipple. It can be painful or hurt your nipple if it is the wrong size or placed wrong. °· Put a small amount of purified or modified lanolin on your nipple and areola if you are sore. °· If you are using an electric pump, change the speed and suction power to be more comfortable. °· You may need a different type of pump if pumping hurts or you do not get a lot of milk. Your doctor can help you pick what type of pump to use. °· Keep a full water bottle near you always. Drinking lots of fluid helps you make more milk. °· You can store your milk to use later. Pumped breast milk can be stored in a sealable, sterile container or plastic bag. Always put the date you pumped it on the container. °¨ Milk can stay out at room temperature for up to 8 hours. °¨ You can store your milk in the refrigerator for up to 8 days. °¨ You can store your milk in the freezer for 3 months. Thaw frozen milk using warm water. Do not put it in the microwave. °· Do not smoke.   Ask your doctor for help. °WHEN SHOULD I CALL MY DOCTOR? °· You have a hard time pumping. °· You are worried you do not make enough milk. °· You have nipple pain, soreness, or redness. °· You want to take birth control pills. °  °This information is not intended to replace advice given to you by your health care provider. Make sure you discuss any questions you have with your health care provider. °  °Document Released: 09/10/2007 Document Revised: 03/29/2013 Document Reviewed: 01/14/2013 °Elsevier Interactive Patient Education ©2016 [employer] Inc[employer] ° °

## 2015-05-17 ENCOUNTER — Encounter: Payer: Self-pay | Admitting: Advanced Practice Midwife

## 2015-05-17 ENCOUNTER — Ambulatory Visit (INDEPENDENT_AMBULATORY_CARE_PROVIDER_SITE_OTHER): Payer: Medicaid Other | Admitting: Advanced Practice Midwife

## 2015-05-17 NOTE — Progress Notes (Signed)
Andrea Stephenson is a 25 y.o. who presents for a postpartum visit. She is 3.5  weeks postpartum following a VAD (for fetal bradycardia d/t rapid dexcent). I have fully reviewed the prenatal and intrapartum course. The delivery was at 41.1 gestational weeks.  She had an IOL for postdates. Anesthesia: epidural. Postpartum course has been uneventful. Baby's course has been uneventful. Baby is feeding by bottle. Bleeding: thin lochia. Bowel function is normal. Bladder function is normal. Patient is not sexually active. Contraception method is undecided.  All options discussed. . Postpartum depression screening: 13. Is at home all day by herself "and has a lot of time to think."  Denies SI/HI.  Offered referral for therapy/possible medication and declined.  Advised to see how she does over the next few weeks, and if mood is not improving (or is declining) let me know. .     Current outpatient prescriptions:  .  calcium carbonate (TUMS - DOSED IN MG ELEMENTAL CALCIUM) 500 MG chewable tablet, Chew 1 tablet by mouth as needed for indigestion or heartburn. Reported on 05/17/2015, Disp: , Rfl:  .  Prenatal Vit-Fe Fumarate-FA (MULTIVITAMIN-PRENATAL) 27-0.8 MG TABS tablet, Take 1 tablet by mouth daily at 12 noon., Disp: , Rfl:   Review of Systems   Constitutional: Negative for fever and chills Eyes: Negative for visual disturbances Respiratory: Negative for shortness of breath, dyspnea Cardiovascular: Negative for chest pain or palpitations  Gastrointestinal: Negative for vomiting, diarrhea and constipation Genitourinary: Negative for dysuria and urgency Musculoskeletal: Negative for back pain, joint pain, myalgias  Neurological: Negative for dizziness and headaches   Objective:     Filed Vitals:   05/17/15 0933  BP: 100/66  Pulse: 66   General:  alert, cooperative and no distress   Breasts:  negative  Lungs: clear to auscultation bilaterally  Heart:  regular rate and rhythm  Abdomen: Soft, nontender   Vulva:  normal  Vagina: normal vagina  Cervix:  closed  Corpus: Well involuted     Rectal Exam: no hemorrhoids        Assessment:    normal postpartum exam.  Plan:    1. Contraception: discussed--will let us know soon.  Advised not to have sex until she gets on [facility]. Follow up in:   or as needed.

## 2015-06-20 ENCOUNTER — Encounter: Payer: Self-pay | Admitting: Women's Health

## 2015-06-20 ENCOUNTER — Ambulatory Visit (INDEPENDENT_AMBULATORY_CARE_PROVIDER_SITE_OTHER): Payer: Medicaid Other | Admitting: Women's Health

## 2015-06-20 VITALS — BP 108/60 | HR 60 | Wt 199.0 lb

## 2015-06-20 DIAGNOSIS — Z30011 Encounter for initial prescription of contraceptive pills: Secondary | ICD-10-CM | POA: Diagnosis not present

## 2015-06-20 DIAGNOSIS — O99345 Other mental disorders complicating the puerperium: Principal | ICD-10-CM

## 2015-06-20 DIAGNOSIS — F53 Postpartum depression: Secondary | ICD-10-CM

## 2015-06-20 DIAGNOSIS — Z3202 Encounter for pregnancy test, result negative: Secondary | ICD-10-CM | POA: Diagnosis not present

## 2015-06-20 LAB — POCT URINE PREGNANCY: Preg Test, Ur: NEGATIVE

## 2015-06-20 MED ORDER — NORETHIN-ETH ESTRAD-FE BIPHAS 1 MG-10 MCG / 10 MCG PO TABS: 1.0000 | ORAL_TABLET | Freq: Every day | ORAL | Status: DC

## 2015-06-20 MED ORDER — ESCITALOPRAM OXALATE 10 MG PO TABS: 10.0000 mg | ORAL_TABLET | Freq: Every day | ORAL | Status: DC

## 2015-06-20 NOTE — Addendum Note (Signed)
Addended by: Gaylyn RongEVANS, Adri Schloss A on: 06/20/2015 04:42 PM   Modules accepted: Orders

## 2015-06-20 NOTE — Patient Instructions (Signed)
Oral Contraception Use Oral contraceptive pills (OCPs) are medicines taken to prevent pregnancy. OCPs work by preventing the ovaries from releasing eggs. The hormones in OCPs also cause the cervical mucus to thicken, preventing the sperm from entering the uterus. The hormones also cause the uterine lining to become thin, not allowing a fertilized egg to attach to the inside of the uterus. OCPs are highly effective when taken exactly as prescribed. However, OCPs do not prevent sexually transmitted diseases (STDs). Safe sex practices, such as using condoms along with an OCP, can help prevent STDs. Before taking OCPs, you may have a physical exam and Pap test. Your health care provider may also order blood tests if necessary. Your health care provider will make sure you are a good candidate for oral contraception. Discuss with your health care provider the possible side effects of the OCP you may be prescribed. When starting an OCP, it can take 2 to 3 months for the body to adjust to the changes in hormone levels in your body.  HOW TO TAKE ORAL CONTRACEPTIVE PILLS Your health care provider may advise you on how to start taking the first cycle of OCPs. Otherwise, you can:   Start on day 1 of your menstrual period. You will not need any backup contraceptive protection with this start time.   Start on the first Sunday after your menstrual period or the day you get your prescription. In these cases, you will need to use backup contraceptive protection for the first week.   Start the pill at any time of your cycle. If you take the pill within 5 days of the start of your period, you are protected against pregnancy right away. In this case, you will not need a backup form of birth control. If you start at any other time of your menstrual cycle, you will need to use another form of birth control for 7 days. If your OCP is the type called a minipill, it will protect you from pregnancy after taking it for 2 days (48  hours). After you have started taking OCPs:   If you forget to take 1 pill, take it as soon as you remember. Take the next pill at the regular time.   If you miss 2 or more pills, call your health care provider because different pills have different instructions for missed doses. Use backup birth control until your next menstrual period starts.   If you use a 28-day pack that contains inactive pills and you miss 1 of the last 7 pills (pills with no hormones), it will not matter. Throw away the rest of the non-hormone pills and start a new pill pack.  No matter which day you start the OCP, you will always start a new pack on that same day of the week. Have an extra pack of OCPs and a backup contraceptive method available in case you miss some pills or lose your OCP pack.  HOME CARE INSTRUCTIONS   Do not smoke.   Always use a condom to protect against STDs. OCPs do not protect against STDs.   Use a calendar to mark your menstrual period days.   Read the information and directions that came with your OCP. Talk to your health care provider if you have questions.  SEEK MEDICAL CARE IF:   You develop nausea and vomiting.   You have abnormal vaginal discharge or bleeding.   You develop a rash.   You miss your menstrual period.   You are losing   your hair.   You need treatment for mood swings or depression.   You get dizzy when taking the OCP.   You develop acne from taking the OCP.   You become pregnant.  SEEK IMMEDIATE MEDICAL CARE IF:   You develop chest pain.   You develop shortness of breath.   You have an uncontrolled or severe headache.   You develop numbness or slurred speech.   You develop visual problems.   You develop pain, redness, and swelling in the legs.    This information is not intended to replace advice given to you by your health care provider. Make sure you discuss any questions you have with your health care provider.   Document  Released: 03/13/2011 Document Revised: 04/14/2014 Document Reviewed: 09/12/2012 Elsevier Interactive Patient Education 2016 Elsevier Inc.  Ethinyl Estradiol; Norethindrone Acetate tablets (contraception) What is this medicine? ETHINYL ESTRADIOL; NORETHINDRONE ACETATE (ETH in il es tra DYE ole; nor eth IN drone AS e tate) is an oral contraceptive. The products combine two types of female hormones, an estrogen and a progestin. They are used to prevent ovulation and pregnancy. This medicine may be used for other purposes; ask your health care provider or pharmacist if you have questions. What should I tell my health care provider before I take this medicine? They need to know if you have or ever had any of these conditions: -abnormal vaginal bleeding -blood vessel disease or blood clots -breast, cervical, endometrial, ovarian, liver, or uterine cancer -diabetes -gallbladder disease -heart disease or recent heart attack -high blood pressure -high cholesterol -kidney disease -liver disease -migraine headaches -stroke -systemic lupus erythematosus (SLE) -tobacco smoker -an unusual or allergic reaction to estrogens, progestins, other medicines, foods, dyes, or preservatives -pregnant or trying to get pregnant -breast-feeding How should I use this medicine? Take this medicine by mouth. To reduce nausea, this medicine may be taken with food. Follow the directions on the prescription label. Take this medicine at the same time each day and in the order directed on the package. Do not take your medicine more often than directed. Contact your pediatrician regarding the use of this medicine in children. Special care may be needed. This medicine has been used in female children who have started having menstrual periods. A patient package insert for the product will be given with each prescription and refill. Read this sheet carefully each time. The sheet may change frequently. Overdosage: If you  think you have taken too much of this medicine contact a poison control center or emergency room at once. NOTE: This medicine is only for you. Do not share this medicine with others. What if I miss a dose? If you miss a dose, refer to the patient information sheet you received with your medicine for direction. If you miss more than one pill, this medicine may not be as effective and you may need to use another form of birth control. What may interact with this medicine? -acetaminophen -antibiotics or medicines for infections, especially rifampin, rifabutin, rifapentine, and griseofulvin, and possibly penicillins or tetracyclines -aprepitant -ascorbic acid (vitamin C) -atorvastatin -barbiturate medicines, such as phenobarbital -bosentan -carbamazepine -caffeine -clofibrate -cyclosporine -dantrolene -doxercalciferol -felbamate -grapefruit juice -hydrocortisone -medicines for anxiety or sleeping problems, such as diazepam or temazepam -medicines for diabetes, including pioglitazone -mineral oil -modafinil -mycophenolate -nefazodone -oxcarbazepine -phenytoin -prednisolone -ritonavir or other medicines for HIV infection or AIDS -rosuvastatin -selegiline -soy isoflavones supplements -St. John's wort -tamoxifen or raloxifene -theophylline -thyroid hormones -topiramate -warfarin This list may not describe all   possible interactions. Give your health care provider a list of all the medicines, herbs, non-prescription drugs, or dietary supplements you use. Also tell them if you smoke, drink alcohol, or use illegal drugs. Some items may interact with your medicine. What should I watch for while using this medicine? Visit your doctor or health care professional for regular checks on your progress. You will need a regular breast and pelvic exam and Pap smear while on this medicine. Use an additional method of contraception during the first cycle that you take these tablets. If you have  any reason to think you are pregnant, stop taking this medicine right away and contact your doctor or health care professional. If you are taking this medicine for hormone related problems, it may take several cycles of use to see improvement in your condition. Smoking increases the risk of getting a blood clot or having a stroke while you are taking birth control pills, especially if you are more than 24 years old. You are strongly advised not to smoke. This medicine can make your body retain fluid, making your fingers, hands, or ankles swell. Your blood pressure can go up. Contact your doctor or health care professional if you feel you are retaining fluid. This medicine can make you more sensitive to the sun. Keep out of the sun. If you cannot avoid being in the sun, wear protective clothing and use sunscreen. Do not use sun lamps or tanning beds/booths. If you wear contact lenses and notice visual changes, or if the lenses begin to feel uncomfortable, consult your eye care specialist. In some women, tenderness, swelling, or minor bleeding of the gums may occur. Notify your dentist if this happens. Brushing and flossing your teeth regularly may help limit this. See your dentist regularly and inform your dentist of the medicines you are taking. If you are going to have elective surgery, you may need to stop taking this medicine before the surgery. Consult your health care professional for advice. This medicine does not protect you against HIV infection (AIDS) or any other sexually transmitted diseases. What side effects may I notice from receiving this medicine? Side effects that you should report to your doctor or health care professional as soon as possible: -breast tissue changes or discharge -changes in vaginal bleeding during your period or between your periods -chest pain -coughing up blood -dizziness or fainting spells -headaches or migraines -leg, arm or groin pain -severe or sudden  headaches -stomach pain (severe) -sudden shortness of breath -sudden loss of coordination, especially on one side of the body -speech problems -symptoms of vaginal infection like itching, irritation or unusual discharge -tenderness in the upper abdomen -vomiting -weakness or numbness in the arms or legs, especially on one side of the body -yellowing of the eyes or skin Side effects that usually do not require medical attention (report to your doctor or health care professional if they continue or are bothersome): -breakthrough bleeding and spotting that continues beyond the 3 initial cycles of pills -breast tenderness -mood changes, anxiety, depression, frustration, anger, or emotional outbursts -increased sensitivity to sun or ultraviolet light -nausea -skin rash, acne, or brown spots on the skin -weight gain (slight) This list may not describe all possible side effects. Call your doctor for medical advice about side effects. You may report side effects to FDA at 1-800-FDA-1088. Where should I keep my medicine? Keep out of the reach of children. Store at room temperature between 15 and 30 degrees C (59 and 86 degrees F).   Throw away any unused medicine after the expiration date. NOTE: This sheet is a summary. It may not cover all possible information. If you have questions about this medicine, talk to your doctor, pharmacist, or health care provider.    2016, Elsevier/Gold Standard. (2012-07-30 15:35:20)  Postpartum Depression and Baby Blues The postpartum period begins right after the birth of a baby. During this time, there is often a great amount of joy and excitement. It is also a time of many changes in the life of the parents. Regardless of how many times a mother gives birth, each child brings new challenges and dynamics to the family. It is not unusual to have feelings of excitement along with confusing shifts in moods, emotions, and thoughts. All mothers are at risk of developing  postpartum depression or the "baby blues." These mood changes can occur right after giving birth, or they may occur many months after giving birth. The baby blues or postpartum depression can be mild or severe. Additionally, postpartum depression can go away rather quickly, or it can be a long-term condition.  CAUSES Raised hormone levels and the rapid drop in those levels are thought to be a main cause of postpartum depression and the baby blues. A number of hormones change during and after pregnancy. Estrogen and progesterone usually decrease right after the delivery of your baby. The levels of thyroid hormone and various cortisol steroids also rapidly drop. Other factors that play a role in these mood changes include major life events and genetics.  RISK FACTORS If you have any of the following risks for the baby blues or postpartum depression, know what symptoms to watch out for during the postpartum period. Risk factors that may increase the likelihood of getting the baby blues or postpartum depression include:  Having a personal or family history of depression.   Having depression while being pregnant.   Having premenstrual mood issues or mood issues related to oral contraceptives.  Having a lot of life stress.   Having marital conflict.   Lacking a social support network.   Having a baby with special needs.   Having health problems, such as diabetes.  SIGNS AND SYMPTOMS Symptoms of baby blues include:  Brief changes in mood, such as going from extreme happiness to sadness.  Decreased concentration.   Difficulty sleeping.   Crying spells, tearfulness.   Irritability.   Anxiety.  Symptoms of postpartum depression typically begin within the first month after giving birth. These symptoms include:  Difficulty sleeping or excessive sleepiness.   Marked weight loss.   Agitation.   Feelings of worthlessness.   Lack of interest in activity or food.   Postpartum psychosis is a very serious condition and can be dangerous. Fortunately, it is rare. Displaying any of the following symptoms is cause for immediate medical attention. Symptoms of postpartum psychosis include:   Hallucinations and delusions.   Bizarre or disorganized behavior.   Confusion or disorientation.  DIAGNOSIS  A diagnosis is made by an evaluation of your symptoms. There are no medical or lab tests that lead to a diagnosis, but there are various questionnaires that a health care provider may use to identify those with the baby blues, postpartum depression, or psychosis. Often, a screening tool called the [place] Postnatal Depression Scale is used to diagnose depression in the postpartum period.  TREATMENT The baby blues usually goes away on its own in 1-2 weeks. Social support is often all that is needed. You will be encouraged to get adequate  sleep and rest. Occasionally, you may be given medicines to help you sleep.  Postpartum depression requires treatment because it can last several months or longer if it is not treated. Treatment may include individual or group therapy, medicine, or both to address any social, physiological, and psychological factors that may play a role in the depression. Regular exercise, a healthy diet, rest, and social support may also be strongly recommended.  Postpartum psychosis is more serious and needs treatment right away. Hospitalization is often needed. HOME CARE INSTRUCTIONS  Get as much rest as you can. Nap when the baby sleeps.   Exercise regularly. Some women find yoga and walking to be beneficial.   Eat a balanced and nourishing diet.   Do little things that you enjoy. Have a cup of tea, take a bubble bath, read your favorite magazine, or listen to your favorite music.  Avoid alcohol.   Ask for help with household chores, cooking, grocery shopping, or running errands as needed. Do not try to do everything.   Talk to  people close to you about how you are feeling. Get support from your partner, family members, friends, or other new moms.  Try to stay positive in how you think. Think about the things you are grateful for.   Do not spend a lot of time alone.   Only take over-the-counter or prescription medicine as directed by your health care provider.  Keep all your postpartum appointments.   Let your health care provider know if you have any concerns.  SEEK MEDICAL CARE IF: You are having a reaction to or problems with your medicine. SEEK IMMEDIATE MEDICAL CARE IF:  You have suicidal feelings.   You think you may harm the baby or someone else. MAKE SURE YOU:  Understand these instructions.  Will watch your condition.  Will get help right away if you are not doing well or get worse.   This information is not intended to replace advice given to you by your health care provider. Make sure you discuss any questions you have with your health care provider.   Document Released: 12/27/2003 Document Revised: 03/29/2013 Document Reviewed: 01/03/2013 Elsevier Interactive Patient Education [employer]2016 Elsevier Inc.

## 2015-06-20 NOTE — Progress Notes (Signed)
Patient ID: Andrea PeerJulia Rucci, female   DOB: 07/25/1990, 25 y.o.   MRN: [id number]   [facility]  Patient name: Andrea PeerJulia Stephenson MRN [id number]  Date of birth: 02/24/1991  CC & HPI:  Andrea Stephenson is a 25 y.o. [id number] Caucasian female presenting today for report of postpartum depression- scored 13 on EPDS at pp visit, today scored 20. STates she cries a lot, doesn't feel like doing anything she used to, just stays in house, eating ok but not sleeping well. Denies SI/HI/II. No h/o depression/anxiety. Discussed option of counseling and/or meds- wants to try both. Also wants to get on birth control- either nuva ring or COCs- discussed both, wants COCs. No h/o HTN, DVT/PE, CVA, MI, or migraines w/ aura.  Smokes~ 8 cigarettes/day. Last sex prior to birth of baby.  Patient's last menstrual period was 05/28/2015.  Pertinent History Reviewed:  Medical & Surgical Hx:   Past medical, surgical, family, and social history reviewed in electronic medical record Medications: Reviewed & Updated - see associated section Allergies: Reviewed in electronic medical record  Objective Findings:  Vitals: BP 108/60 mmHg  Pulse 60  Wt 199 lb (90.266 kg)  LMP 05/28/2015  Breastfeeding? No Body mass index is 34.14 kg/(m^2).  Physical Examination: General appearance - alert, well appearing, and in no distress  No results found for this or any previous visit (from the past 24 hour(s)).   Assessment & Plan:  A:   Postpartum depression  Contraception management  Smoker  P:  Rx lexapro 10mg  #30 w/ 6RF, [facility]  Rx LoLoestrin 3pack w/ 3RF  Advised smoking cessation  Return in about 4 weeks (around 07/18/2015) for F/U, then 3mths for f/u.  Marge DuncansBooker, Keefer Soulliere Randall CNM, [facility] 06/20/2015 4:27 PM

## 2015-07-17 ENCOUNTER — Ambulatory Visit: Payer: Medicaid Other | Admitting: Women's Health

## 2015-07-18 ENCOUNTER — Ambulatory Visit: Payer: Medicaid Other | Admitting: Women's Health

## 2015-09-19 ENCOUNTER — Ambulatory Visit: Payer: Medicaid Other | Admitting: Women's Health

## 2015-09-24 ENCOUNTER — Ambulatory Visit: Payer: Medicaid Other | Admitting: Women's Health

## 2015-09-27 ENCOUNTER — Ambulatory Visit (INDEPENDENT_AMBULATORY_CARE_PROVIDER_SITE_OTHER): Payer: Medicaid Other | Admitting: Advanced Practice Midwife

## 2015-09-27 ENCOUNTER — Encounter: Payer: Self-pay | Admitting: Advanced Practice Midwife

## 2015-09-27 VITALS — BP 114/60 | HR 58 | Wt 195.0 lb

## 2015-09-27 DIAGNOSIS — F53 Postpartum depression: Principal | ICD-10-CM

## 2015-09-27 DIAGNOSIS — O99345 Other mental disorders complicating the puerperium: Secondary | ICD-10-CM

## 2015-09-27 DIAGNOSIS — Z3041 Encounter for surveillance of contraceptive pills: Secondary | ICD-10-CM

## 2015-09-27 MED ORDER — ESCITALOPRAM OXALATE 10 MG PO TABS: 10.0000 mg | ORAL_TABLET | Freq: Every day | ORAL | Status: DC

## 2015-09-27 NOTE — Progress Notes (Signed)
   [facility]  Patient name: Andrea Stephenson Barham MRN [id number]  Date of birth: 08/06/1990  CC & HPI:  Andrea Stephenson is a 25 y.o. Caucasian female presenting today for med check.  She was started on lexapro 10mg  for PPD and LoLoestrin 3 months ago after having her baby.  She was slow to remember to take daily lexapro (but not the COC!), but now that she takes it daily, she has seen a big improvement in her mood. Not having any SE from COC. Wants to continue w/both meds  Pertinent History Reviewed:  Medical & Surgical Hx:   Past Medical History  Diagnosis Date  . History of ADHD 10/06/2012  . Anxiety    Past Surgical History  Procedure Laterality Date  . No past surgeries     Family History  Problem Relation Age of Onset  . Diabetes Mother   . Hypertension Mother     Current outpatient prescriptions:  .  escitalopram (LEXAPRO) 10 MG tablet, Take 1 tablet (10 mg total) by mouth daily., Disp: 30 tablet, Rfl: 6 .  calcium carbonate (TUMS - DOSED IN MG ELEMENTAL CALCIUM) 500 MG chewable tablet, Chew 1 tablet by mouth as needed for indigestion or heartburn. Reported on 09/27/2015, Disp: , Rfl:  .  Norethindrone-Ethinyl Estradiol-Fe Biphas (LO LOESTRIN FE) 1 MG-10 MCG / 10 MCG tablet, Take 1 tablet by mouth daily., Disp: 3 Package, Rfl: 3 .  Prenatal Vit-Fe Fumarate-FA (MULTIVITAMIN-PRENATAL) 27-0.8 MG TABS tablet, Take 1 tablet by mouth daily at 12 noon. Reported on 09/27/2015, Disp: , Rfl:  Social History: Reviewed -  reports that she has been smoking Cigarettes.  She has been smoking about 0.50 packs per day. She has never used smokeless tobacco.  Review of Systems:   Constitutional: Negative for fever and chills Eyes: Negative for visual disturbances Respiratory: Negative for shortness of breath, dyspnea Cardiovascular: Negative for chest pain or palpitations  Gastrointestinal: Negative for vomiting, diarrhea and constipation; no abdominal pain Genitourinary: Negative for dysuria  and urgency, vaginal irritation or itching Musculoskeletal: Negative for back pain, joint pain, myalgias  Neurological: Negative for dizziness and headaches    Objective Findings:    Physical Examination: General appearance - well appearing, and in no distress Mental status - alert, oriented to person, place, and time Chest:  Normal respiratory effort Heart - normal rate and regular rhythm Abdomen:  Soft, nontender Pelvic: deferred Musculoskeletal:  Normal range of motion without pain Extremities:  No edema    No results found for this or any previous visit (from the past 24 hour(s)).    Assessment & Plan:  A:   Contraception management, doing well  PPD, much improved w/Lexapro P:  Continue both meds   Return if symptoms worsen or fail to improve.  CRESENZO-DISHMAN,Rickita Forstner CNM 09/27/2015 2:12 PM

## 2016-04-07 NOTE — L&D Delivery Note (Signed)
   Delivery Note At 9:07 AM a viable female was delivered via  (Presentation:roa ;  ).  APGAR: 8/9, ; weight  Pending   The shoulders were not  Easily forthcoming, so the posterior (RIGHT) axilla was grasped with my index finger, and the baby was rotated counter clockwise into the oblique diameter.  At this point, the (now) anterior shoulder was released, and the baby delivered.  At no time was any traction placed on the baby's head.  After 1 minute, the cord was clamped and cut. 40 units of pitocin diluted in 1000cc LR was infused rapidly IV.  The placenta separated spontaneously and delivered via CCT and maternal pushing effort.  It was inspected and appears to be intact with a 3 VC Anesthesia: epidural  Episiotomy:  none Lacerations:  none Suture Repair: n/a Est. Blood Loss (mL): 300    Mom to postpartum.  Baby to Couplet care / Skin to Skin.  CRESENZO-DISHMAN,Vendela Troung 01/16/2017, 9:25 AM

## 2016-06-03 ENCOUNTER — Other Ambulatory Visit: Payer: Self-pay | Admitting: Obstetrics and Gynecology

## 2016-06-03 DIAGNOSIS — O3680X0 Pregnancy with inconclusive fetal viability, not applicable or unspecified: Secondary | ICD-10-CM

## 2016-06-04 ENCOUNTER — Ambulatory Visit (INDEPENDENT_AMBULATORY_CARE_PROVIDER_SITE_OTHER): Payer: Medicaid Other

## 2016-06-04 ENCOUNTER — Ambulatory Visit: Payer: Medicaid Other | Admitting: *Deleted

## 2016-06-04 DIAGNOSIS — O3680X0 Pregnancy with inconclusive fetal viability, not applicable or unspecified: Secondary | ICD-10-CM | POA: Diagnosis not present

## 2016-06-04 DIAGNOSIS — Z3A08 8 weeks gestation of pregnancy: Secondary | ICD-10-CM | POA: Diagnosis not present

## 2016-06-04 NOTE — Progress Notes (Signed)
US 7+6 wks,single IUP w/ys,crl 16.5 mm,normal ov's bilat,EDD 01/15/2017

## 2016-06-25 ENCOUNTER — Encounter: Payer: Self-pay | Admitting: Advanced Practice Midwife

## 2016-06-25 ENCOUNTER — Ambulatory Visit: Payer: Medicaid Other | Admitting: *Deleted

## 2016-06-25 ENCOUNTER — Ambulatory Visit (INDEPENDENT_AMBULATORY_CARE_PROVIDER_SITE_OTHER): Payer: Medicaid Other | Admitting: Advanced Practice Midwife

## 2016-06-25 VITALS — BP 108/84 | HR 60 | Wt 186.0 lb

## 2016-06-25 DIAGNOSIS — Z1389 Encounter for screening for other disorder: Secondary | ICD-10-CM

## 2016-06-25 DIAGNOSIS — Z3481 Encounter for supervision of other normal pregnancy, first trimester: Secondary | ICD-10-CM | POA: Diagnosis not present

## 2016-06-25 DIAGNOSIS — Z331 Pregnant state, incidental: Secondary | ICD-10-CM

## 2016-06-25 DIAGNOSIS — Z3A10 10 weeks gestation of pregnancy: Secondary | ICD-10-CM

## 2016-06-25 DIAGNOSIS — Z3490 Encounter for supervision of normal pregnancy, unspecified, unspecified trimester: Secondary | ICD-10-CM | POA: Insufficient documentation

## 2016-06-25 DIAGNOSIS — Z3682 Encounter for antenatal screening for nuchal translucency: Secondary | ICD-10-CM

## 2016-06-25 DIAGNOSIS — F419 Anxiety disorder, unspecified: Secondary | ICD-10-CM | POA: Insufficient documentation

## 2016-06-25 LAB — POCT URINALYSIS DIPSTICK
Blood, UA: NEGATIVE
Glucose, UA: NEGATIVE
KETONES UA: NEGATIVE
LEUKOCYTES UA: NEGATIVE
NITRITE UA: NEGATIVE
Protein, UA: NEGATIVE

## 2016-06-25 MED ORDER — PRENATE MINI 18-0.6-0.4-350 MG PO CAPS: 1.0000 | ORAL_CAPSULE | Freq: Every day | ORAL | 11 refills | Status: DC

## 2016-06-25 NOTE — Patient Instructions (Signed)
 First Trimester of Pregnancy The first trimester of pregnancy is from week 1 until the end of week 12 (months 1 through 3). A week after a sperm fertilizes an egg, the egg will implant on the wall of the uterus. This embryo will begin to develop into a baby. Genes from you and your partner are forming the baby. The female genes determine whether the baby is a boy or a girl. At 6-8 weeks, the eyes and face are formed, and the heartbeat can be seen on ultrasound. At the end of 12 weeks, all the baby's organs are formed.  Now that you are pregnant, you will want to do everything you can to have a healthy baby. Two of the most important things are to get good prenatal care and to follow your health care provider's instructions. Prenatal care is all the medical care you receive before the baby's birth. This care will help prevent, find, and treat any problems during the pregnancy and childbirth. BODY CHANGES Your body goes through many changes during pregnancy. The changes vary from woman to woman.   You may gain or lose a couple of pounds at first.  You may feel sick to your stomach (nauseous) and throw up (vomit). If the vomiting is uncontrollable, call your health care provider.  You may tire easily.  You may develop headaches that can be relieved by medicines approved by your health care provider.  You may urinate more often. Painful urination may mean you have a bladder infection.  You may develop heartburn as a result of your pregnancy.  You may develop constipation because certain hormones are causing the muscles that push waste through your intestines to slow down.  You may develop hemorrhoids or swollen, bulging veins (varicose veins).  Your breasts may begin to grow larger and become tender. Your nipples may stick out more, and the tissue that surrounds them (areola) may become darker.  Your gums may bleed and may be sensitive to brushing and flossing.  Dark spots or blotches  (chloasma, mask of pregnancy) may develop on your face. This will likely fade after the baby is born.  Your menstrual periods will stop.  You may have a loss of appetite.  You may develop cravings for certain kinds of food.  You may have changes in your emotions from day to day, such as being excited to be pregnant or being concerned that something may go wrong with the pregnancy and baby.  You may have more vivid and strange dreams.  You may have changes in your hair. These can include thickening of your hair, rapid growth, and changes in texture. Some women also have hair loss during or after pregnancy, or hair that feels dry or thin. Your hair will most likely return to normal after your baby is born. WHAT TO EXPECT AT YOUR PRENATAL VISITS During a routine prenatal visit:  You will be weighed to make sure you and the baby are growing normally.  Your blood pressure will be taken.  Your abdomen will be measured to track your baby's growth.  The fetal heartbeat will be listened to starting around week 10 or 12 of your pregnancy.  Test results from any previous visits will be discussed. Your health care provider may ask you:  How you are feeling.  If you are feeling the baby move.  If you have had any abnormal symptoms, such as leaking fluid, bleeding, severe headaches, or abdominal cramping.  If you have any questions. Other   tests that may be performed during your first trimester include:  Blood tests to find your blood type and to check for the presence of any previous infections. They will also be used to check for low iron levels (anemia) and Rh antibodies. Later in the pregnancy, blood tests for diabetes will be done along with other tests if problems develop.  Urine tests to check for infections, diabetes, or protein in the urine.  An ultrasound to confirm the proper growth and development of the baby.  An amniocentesis to check for possible genetic problems.  Fetal  screens for spina bifida and Down syndrome.  You may need other tests to make sure you and the baby are doing well. HOME CARE INSTRUCTIONS  Medicines  Follow your health care provider's instructions regarding medicine use. Specific medicines may be either safe or unsafe to take during pregnancy.  Take your prenatal vitamins as directed.  If you develop constipation, try taking a stool softener if your health care provider approves. Diet  Eat regular, well-balanced meals. Choose a variety of foods, such as meat or vegetable-based protein, fish, milk and low-fat dairy products, vegetables, fruits, and whole grain breads and cereals. Your health care provider will help you determine the amount of weight gain that is right for you.  Avoid raw meat and uncooked cheese. These carry germs that can cause birth defects in the baby.  Eating four or five small meals rather than three large meals a day may help relieve nausea and vomiting. If you start to feel nauseous, eating a few soda crackers can be helpful. Drinking liquids between meals instead of during meals also seems to help nausea and vomiting.  If you develop constipation, eat more high-fiber foods, such as fresh vegetables or fruit and whole grains. Drink enough fluids to keep your urine clear or pale yellow. Activity and Exercise  Exercise only as directed by your health care provider. Exercising will help you:  Control your weight.  Stay in shape.  Be prepared for labor and delivery.  Experiencing pain or cramping in the lower abdomen or low back is a good sign that you should stop exercising. Check with your health care provider before continuing normal exercises.  Try to avoid standing for long periods of time. Move your legs often if you must stand in one place for a long time.  Avoid heavy lifting.  Wear low-heeled shoes, and practice good posture.  You may continue to have sex unless your health care provider directs you  otherwise. Relief of Pain or Discomfort  Wear a good support bra for breast tenderness.   Take warm sitz baths to soothe any pain or discomfort caused by hemorrhoids. Use hemorrhoid cream if your health care provider approves.   Rest with your legs elevated if you have leg cramps or low back pain.  If you develop varicose veins in your legs, wear support hose. Elevate your feet for 15 minutes, 3-4 times a day. Limit salt in your diet. Prenatal Care  Schedule your prenatal visits by the twelfth week of pregnancy. They are usually scheduled monthly at first, then more often in the last 2 months before delivery.  Write down your questions. Take them to your prenatal visits.  Keep all your prenatal visits as directed by your health care provider. Safety  Wear your seat belt at all times when driving.  Make a list of emergency phone numbers, including numbers for family, friends, the hospital, and police and fire departments. General   Tips  Ask your health care provider for a referral to a local prenatal education class. Begin classes no later than at the beginning of month 6 of your pregnancy.  Ask for help if you have counseling or nutritional needs during pregnancy. Your health care provider can offer advice or refer you to specialists for help with various needs.  Do not use hot tubs, steam rooms, or saunas.  Do not douche or use tampons or scented sanitary pads.  Do not cross your legs for long periods of time.  Avoid cat litter boxes and soil used by cats. These carry germs that can cause birth defects in the baby and possibly loss of the fetus by miscarriage or stillbirth.  Avoid all smoking, herbs, alcohol, and medicines not prescribed by your health care provider. Chemicals in these affect the formation and growth of the baby.  Schedule a dentist appointment. At home, brush your teeth with a soft toothbrush and be gentle when you floss. SEEK MEDICAL CARE IF:   You have  dizziness.  You have mild pelvic cramps, pelvic pressure, or nagging pain in the abdominal area.  You have persistent nausea, vomiting, or diarrhea.  You have a bad smelling vaginal discharge.  You have pain with urination.  You notice increased swelling in your face, hands, legs, or ankles. SEEK IMMEDIATE MEDICAL CARE IF:   You have a fever.  You are leaking fluid from your vagina.  You have spotting or bleeding from your vagina.  You have severe abdominal cramping or pain.  You have rapid weight gain or loss.  You vomit blood or material that looks like coffee grounds.  You are exposed to German measles and have never had them.  You are exposed to fifth disease or chickenpox.  You develop a severe headache.  You have shortness of breath.  You have any kind of trauma, such as from a fall or a car accident. Document Released: 03/18/2001 Document Revised: 08/08/2013 Document Reviewed: 02/01/2013 [employer] Patient Information 2015 [employer]. This information is not intended to replace advice given to you by your health care provider. Make sure you discuss any questions you have with your health care provider.   Nausea & Vomiting  Have saltine crackers or pretzels by your bed and eat a few bites before you raise your head out of bed in the morning  Eat small frequent meals throughout the day instead of large meals  Drink plenty of fluids throughout the day to stay hydrated, just don't drink a lot of fluids with your meals.  This can make your stomach fill up faster making you feel sick  Do not brush your teeth right after you eat  Products with real ginger are good for nausea, like ginger ale and ginger hard candy Make sure it says made with real ginger!  Sucking on sour candy like lemon heads is also good for nausea  If your prenatal vitamins make you nauseated, take them at night so you will sleep through the nausea  Sea Bands  If you feel like you need  medicine for the nausea & vomiting please let us know  If you are unable to keep any fluids or food down please let us know   Constipation  Drink plenty of fluid, preferably water, throughout the day  Eat foods high in fiber such as fruits, vegetables, and grains  Exercise, such as walking, is a good way to keep your bowels regular  Drink warm fluids, especially warm   prune juice, or decaf coffee  Eat a 1/2 cup of real oatmeal (not instant), 1/2 cup applesauce, and 1/2-1 cup warm prune juice every day  If needed, you may take Colace (docusate sodium) stool softener once or twice a day to help keep the stool soft. If you are pregnant, wait until you are out of your first trimester (12-14 weeks of pregnancy)  If you still are having problems with constipation, you may take Miralax once daily as needed to help keep your bowels regular.  If you are pregnant, wait until you are out of your first trimester (12-14 weeks of pregnancy)  Safe Medications in Pregnancy   Acne: Benzoyl Peroxide Salicylic Acid  Backache/Headache: Tylenol: 2 regular strength every 4 hours OR              2 Extra strength every 6 hours  Colds/Coughs/Allergies: Benadryl (alcohol free) 25 mg every 6 hours as needed Breath right strips Claritin Cepacol throat lozenges Chloraseptic throat spray Cold-Eeze- up to three times per day Cough drops, alcohol free Flonase (by prescription only) Guaifenesin Mucinex Robitussin DM (plain only, alcohol free) Saline nasal spray/drops Sudafed (pseudoephedrine) & Actifed ** use only after [redacted] weeks gestation and if you do not have high blood pressure Tylenol Vicks Vaporub Zinc lozenges Zyrtec   Constipation: Colace Ducolax suppositories Fleet enema Glycerin suppositories Metamucil Milk of magnesia Miralax Senokot Smooth move tea  Diarrhea: Kaopectate Imodium A-D  *NO pepto Bismol  Hemorrhoids: Anusol Anusol HC Preparation  H Tucks  Indigestion: Tums Maalox Mylanta Zantac  Pepcid  Insomnia: Benadryl (alcohol free) 25mg every 6 hours as needed Tylenol PM Unisom, no Gelcaps  Leg Cramps: Tums MagGel  Nausea/Vomiting:  Bonine Dramamine Emetrol Ginger extract Sea bands Meclizine  Nausea medication to take during pregnancy:  Unisom (doxylamine succinate 25 mg tablets) Take one tablet daily at bedtime. If symptoms are not adequately controlled, the dose can be increased to a maximum recommended dose of two tablets daily (1/2 tablet in the morning, 1/2 tablet mid-afternoon and one at bedtime). Vitamin B6 100mg tablets. Take one tablet twice a day (up to 200 mg per day).  Skin Rashes: Aveeno products Benadryl cream or 25mg every 6 hours as needed Calamine Lotion 1% cortisone cream  Yeast infection: Gyne-lotrimin 7 Monistat 7   **If taking multiple medications, please check labels to avoid duplicating the same active ingredients **take medication as directed on the label ** Do not exceed 4000 mg of tylenol in 24 hours **Do not take medications that contain aspirin or ibuprofen      

## 2016-06-25 NOTE — Progress Notes (Signed)
  Subjective:    Andrea Stephenson is a G2P1001 8489w6d being seen today for her first obstetrical visit.  Her obstetrical history is significant for term SVD without problems.  Pregnancy history fully reviewed.  Patient reports just normal fatigue, some nausea, no vomiting..  Vitals:   06/25/16 1031  BP: 108/84  Pulse: 60  Weight: 186 lb (84.4 kg)    HISTORY: OB History  Gravida Para Term Preterm AB Living  2 1 1     1   SAB TAB Ectopic Multiple Live Births        0 1    # Outcome Date GA Lbr Len/2nd Weight Sex Delivery Anes PTL Lv  2 Current           1 Term 04/17/15 2633w1d 03:43 / 00:18 6 lb 6.3 oz (2.9 kg) F Vag-Vacuum EPI N LIV     Past Medical History:  Diagnosis Date  . Anxiety   . History of ADHD 10/06/2012   Past Surgical History:  Procedure Laterality Date  . NO PAST SURGERIES     Family History  Problem Relation Age of Onset  . Diabetes Mother   . Hypertension Mother   . Hypertension Father   . Cerebral palsy Sister   . Cancer Maternal Grandmother     lung ca  . Diabetes Maternal Grandmother   . Hypertension Maternal Grandmother   . Heart attack Maternal Grandfather   . Heart attack Paternal Grandmother   . Heart disease Paternal Grandmother   . Heart attack Paternal Grandfather   . Heart disease Paternal Grandfather      Exam                                      System:     Skin: normal coloration and turgor, no rashes    Neurologic: oriented, normal, normal mood   Extremities: normal strength, tone, and muscle mass   HEENT PERRLA   Mouth/Teeth mucous membranes moist, normal dentition   Neck supple and no masses   Cardiovascular: regular rate and rhythm   Respiratory:  appears well, vitals normal, no respiratory distress, acyanotic   Abdomen: soft, non-tender;  FHR: 160 US          Assessment:    Pregnancy: G2P1001 Patient Active Problem List   Diagnosis Date Noted  . Encounter for supervision of other normal pregnancy 06/25/2016  .  Anxiety 06/25/2016  . Postpartum depression 06/20/2015  . History of ADHD 10/06/2012        Plan:     Initial labs drawn. Continue prenatal vitamins  Problem list reviewed and updated  Reviewed n/v relief measures and warning s/s to report  Reviewed recommended weight gain based on pre-gravid BMI  Encouraged well-balanced diet Genetic Screening discussed Integrated Screen: requested.  Ultrasound discussed; fetal survey: requested.  Return in about 2 weeks (around 07/09/2016) for LROB, US:NT+1st IT.  CRESENZO-DISHMAN,Keonia Pasko 06/25/2016

## 2016-06-26 LAB — PMP SCREEN PROFILE (10S), URINE
Amphetamine Screen, Ur: NEGATIVE ng/mL
BARBITURATE SCRN UR: NEGATIVE ng/mL
BENZODIAZEPINE SCREEN, URINE: NEGATIVE ng/mL
CREATININE(CRT), U: 139.1 mg/dL (ref 20.0–300.0)
Cannabinoids Ur Ql Scn: NEGATIVE ng/mL
Cocaine(Metab.)Screen, Urine: NEGATIVE ng/mL
Methadone Scn, Ur: NEGATIVE ng/mL
Opiate Scrn, Ur: NEGATIVE ng/mL
Oxycodone+Oxymorphone Ur Ql Scn: NEGATIVE ng/mL
PCP Scrn, Ur: NEGATIVE ng/mL
PH UR, DRUG SCRN: 5.9 (ref 4.5–8.9)
Propoxyphene, Screen: NEGATIVE ng/mL

## 2016-06-26 LAB — RPR: RPR: NONREACTIVE

## 2016-06-26 LAB — CBC
Hematocrit: 36.6 % (ref 34.0–46.6)
Hemoglobin: 12.4 g/dL (ref 11.1–15.9)
MCH: 26 pg — AB (ref 26.6–33.0)
MCHC: 33.9 g/dL (ref 31.5–35.7)
MCV: 77 fL — ABNORMAL LOW (ref 79–97)
PLATELETS: 181 10*3/uL (ref 150–379)
RBC: 4.77 x10E6/uL (ref 3.77–5.28)
RDW: 14.4 % (ref 12.3–15.4)
WBC: 7.9 10*3/uL (ref 3.4–10.8)

## 2016-06-26 LAB — GC/CHLAMYDIA PROBE AMP
CHLAMYDIA, DNA PROBE: NEGATIVE
Neisseria gonorrhoeae by PCR: NEGATIVE

## 2016-06-26 LAB — VARICELLA ZOSTER ANTIBODY, IGG: Varicella zoster IgG: >4000 index (ref >165)

## 2016-06-26 LAB — ANTIBODY SCREEN: ANTIBODY SCREEN: NEGATIVE

## 2016-06-26 LAB — HEPATITIS B SURFACE ANTIGEN: Hepatitis B Surface Ag: NEGATIVE

## 2016-06-26 LAB — HIV ANTIBODY (ROUTINE TESTING W REFLEX): HIV Screen 4th Generation wRfx: NONREACTIVE

## 2016-06-27 LAB — URINE CULTURE: ORGANISM ID, BACTERIA: NO GROWTH

## 2016-07-02 ENCOUNTER — Ambulatory Visit (INDEPENDENT_AMBULATORY_CARE_PROVIDER_SITE_OTHER): Payer: Medicaid Other

## 2016-07-02 ENCOUNTER — Other Ambulatory Visit: Payer: Medicaid Other

## 2016-07-02 DIAGNOSIS — Z3682 Encounter for antenatal screening for nuchal translucency: Secondary | ICD-10-CM

## 2016-07-02 DIAGNOSIS — Z3481 Encounter for supervision of other normal pregnancy, first trimester: Secondary | ICD-10-CM

## 2016-07-02 NOTE — Progress Notes (Signed)
US 11+6 wks,measurements c/w dates,normal ov's bilat,NT 1.0 mm,NB present,crl 56.9 mm, fhr 143 bpm

## 2016-07-08 LAB — MATERNAL SCREEN, INTEGRATED #1
CROWN RUMP LENGTH MAT SCREEN: 56.9 mm
Gest. Age on Collection Date: 12.3 weeks
MATERNAL AGE AT EDD: 25.3 a
NUMBER OF FETUSES: 1
Nuchal Translucency (NT): 1 mm
PAPP-A Value: 449.2 ng/mL
Weight: 185 [lb_av]

## 2016-07-23 ENCOUNTER — Ambulatory Visit (INDEPENDENT_AMBULATORY_CARE_PROVIDER_SITE_OTHER): Payer: Medicaid Other | Admitting: Women's Health

## 2016-07-23 ENCOUNTER — Encounter: Payer: Self-pay | Admitting: Women's Health

## 2016-07-23 VITALS — BP 110/60 | HR 70 | Wt 188.0 lb

## 2016-07-23 DIAGNOSIS — Z1389 Encounter for screening for other disorder: Secondary | ICD-10-CM

## 2016-07-23 DIAGNOSIS — Z3682 Encounter for antenatal screening for nuchal translucency: Secondary | ICD-10-CM

## 2016-07-23 DIAGNOSIS — O09892 Supervision of other high risk pregnancies, second trimester: Secondary | ICD-10-CM

## 2016-07-23 DIAGNOSIS — Z6791 Unspecified blood type, Rh negative: Secondary | ICD-10-CM

## 2016-07-23 DIAGNOSIS — O26899 Other specified pregnancy related conditions, unspecified trimester: Secondary | ICD-10-CM

## 2016-07-23 DIAGNOSIS — Z3482 Encounter for supervision of other normal pregnancy, second trimester: Secondary | ICD-10-CM

## 2016-07-23 DIAGNOSIS — Z331 Pregnant state, incidental: Secondary | ICD-10-CM

## 2016-07-23 DIAGNOSIS — Z363 Encounter for antenatal screening for malformations: Secondary | ICD-10-CM

## 2016-07-23 LAB — POCT URINALYSIS DIPSTICK
Blood, UA: NEGATIVE
GLUCOSE UA: NEGATIVE
Ketones, UA: NEGATIVE
LEUKOCYTES UA: NEGATIVE
NITRITE UA: NEGATIVE
Protein, UA: NEGATIVE

## 2016-07-23 NOTE — Progress Notes (Signed)
Low-risk OB appointment G2P1001 [redacted]w[redacted]d Estimated Date of Delivery: 01/15/17 BP 110/60   Pulse 70   Wt 188 lb (85.3 kg)   LMP 04/10/2016 (Exact Date)   BMI 32.27 kg/m   BP, weight, and urine reviewed.  Refer to obstetrical flow sheet for FH & FHR.  No fm yet. Denies cramping, lof, vb, or uti s/s. No complaints.  Reviewed warning s/s to report. Plan:  Continue routine obstetrical care  F/U in 4wks for OB appointment and anatomy u/s 2nd IT today

## 2016-07-23 NOTE — Patient Instructions (Signed)

## 2016-07-26 LAB — MATERNAL SCREEN, INTEGRATED #2
AFP MoM: 0.48
Alpha-Fetoprotein: 11.7 ng/mL
Crown Rump Length: 56.9 mm
DIA MOM: 1.21
DIA VALUE: 189 pg/mL
Estriol, Unconjugated: 0.42 ng/mL
Gest. Age on Collection Date: 12.3 weeks
Gestational Age: 15.3 weeks
Maternal Age at EDD: 25.3 yr
NUCHAL TRANSLUCENCY (NT): 1 mm
NUCHAL TRANSLUCENCY MOM: 0.68
Number of Fetuses: 1
PAPP-A MOM: 0.68
PAPP-A VALUE: 449.2 ng/mL
Test Results:: NEGATIVE
WEIGHT: 185 [lb_av]
Weight: 185 [lb_av]
hCG MoM: 2.06
hCG Value: 74.5 IU/mL
uE3 MoM: 0.68

## 2016-07-28 ENCOUNTER — Encounter: Payer: Self-pay | Admitting: Women's Health

## 2016-08-15 ENCOUNTER — Inpatient Hospital Stay (HOSPITAL_COMMUNITY)
Admission: AD | Admit: 2016-08-15 | Discharge: 2016-08-15 | Disposition: A | Discharge status: Home / Self Care | Payer: Medicaid Other | Source: Ambulatory Visit | Attending: Family Medicine | Admitting: Family Medicine

## 2016-08-15 ENCOUNTER — Encounter (HOSPITAL_COMMUNITY): Payer: Self-pay

## 2016-08-15 DIAGNOSIS — N898 Other specified noninflammatory disorders of vagina: Secondary | ICD-10-CM

## 2016-08-15 DIAGNOSIS — Z3A18 18 weeks gestation of pregnancy: Secondary | ICD-10-CM | POA: Insufficient documentation

## 2016-08-15 DIAGNOSIS — O26892 Other specified pregnancy related conditions, second trimester: Secondary | ICD-10-CM | POA: Diagnosis not present

## 2016-08-15 DIAGNOSIS — Z87891 Personal history of nicotine dependence: Secondary | ICD-10-CM | POA: Diagnosis not present

## 2016-08-15 LAB — URINALYSIS, ROUTINE W REFLEX MICROSCOPIC
Bacteria, UA: NONE SEEN
Bilirubin Urine: NEGATIVE
Glucose, UA: NEGATIVE mg/dL
Hgb urine dipstick: NEGATIVE
Ketones, ur: NEGATIVE mg/dL
Nitrite: NEGATIVE
PH: 5 (ref 5.0–8.0)
Protein, ur: NEGATIVE mg/dL
SPECIFIC GRAVITY, URINE: 1.019 (ref 1.005–1.030)

## 2016-08-15 LAB — WET PREP, GENITAL
Clue Cells Wet Prep HPF POC: NONE SEEN
Sperm: NONE SEEN
TRICH WET PREP: NONE SEEN
Yeast Wet Prep HPF POC: NONE SEEN

## 2016-08-15 NOTE — Discharge Instructions (Signed)
Second Trimester of Pregnancy The second trimester is from week 13 through week 28, month 4 through 6. This is often the time in pregnancy that you feel your best. Often times, morning sickness has lessened or quit. You may have more energy, and you may get hungry more often. Your unborn baby (fetus) is growing rapidly. At the end of the sixth month, he or she is about 9 inches long and weighs about 1 pounds. You will likely feel the baby move (quickening) between 18 and 20 weeks of pregnancy. Follow these instructions at home:  Avoid all smoking, herbs, and alcohol. Avoid drugs not approved by your doctor.  Do not use any tobacco products, including cigarettes, chewing tobacco, and electronic cigarettes. If you need help quitting, ask your doctor. You may get counseling or other support to help you quit.  Only take medicine as told by your doctor. Some medicines are safe and some are not during pregnancy.  Exercise only as told by your doctor. Stop exercising if you start having cramps.  Eat regular, healthy meals.  Wear a good support bra if your breasts are tender.  Do not use hot tubs, steam rooms, or saunas.  Wear your seat belt when driving.  Avoid raw meat, uncooked cheese, and liter boxes and soil used by cats.  Take your prenatal vitamins.  Take 1500-2000 milligrams of calcium daily starting at the 20th week of pregnancy until you deliver your baby.  Try taking medicine that helps you poop (stool softener) as needed, and if your doctor approves. Eat more fiber by eating fresh fruit, vegetables, and whole grains. Drink enough fluids to keep your pee (urine) clear or pale yellow.  Take warm water baths (sitz baths) to soothe pain or discomfort caused by hemorrhoids. Use hemorrhoid cream if your doctor approves.  If you have puffy, bulging veins (varicose veins), wear support hose. Raise (elevate) your feet for 15 minutes, 3-4 times a day. Limit salt in your diet.  Avoid heavy  lifting, wear low heals, and sit up straight.  Rest with your legs raised if you have leg cramps or low back pain.  Visit your dentist if you have not gone during your pregnancy. Use a soft toothbrush to brush your teeth. Be gentle when you floss.  You can have sex (intercourse) unless your doctor tells you not to.  Go to your doctor visits. Get help if:  You feel dizzy.  You have mild cramps or pressure in your lower belly (abdomen).  You have a nagging pain in your belly area.  You continue to feel sick to your stomach (nauseous), throw up (vomit), or have watery poop (diarrhea).  You have bad smelling fluid coming from your vagina.  You have pain with peeing (urination). Get help right away if:  You have a fever.  You are leaking fluid from your vagina.  You have spotting or bleeding from your vagina.  You have severe belly cramping or pain.  You lose or gain weight rapidly.  You have trouble catching your breath and have chest pain.  You notice sudden or extreme puffiness (swelling) of your face, hands, ankles, feet, or legs.  You have not felt the baby move in over an hour.  You have severe headaches that do not go away with medicine.  You have vision changes. This information is not intended to replace advice given to you by your health care provider. Make sure you discuss any questions you have with your health care   provider. Document Released: 06/18/2009 Document Revised: 08/30/2015 Document Reviewed: 05/25/2012 [employer] Interactive Patient Education  2017 [employer]  

## 2016-08-15 NOTE — MAU Provider Note (Signed)
History     CSN: [id number]  Arrival date and time: 08/15/16 4098   First Provider Initiated Contact with Patient 08/15/16 0818      Chief Complaint  Patient presents with  . Vaginal Discharge   HPI Ms. Andrea Stephenson is a 26 y.o. G2P1001 at [redacted]w[redacted]d who presents to MAU today with complaint of LOF. She states that she noted a small amount of clear wet discharge in her underwear last night and then she woke up this morning in a puddle of water. She states that it had no color or odor. She denies vaginal bleeding or abdominal pain. She denies complications with this pregnancy. She is a patient at [facility].   OB History    Gravida Para Term Preterm AB Living   2 1 1     1    SAB TAB Ectopic Multiple Live Births         0 1      Past Medical History:  Diagnosis Date  . Anxiety   . History of ADHD 10/06/2012    Past Surgical History:  Procedure Laterality Date  . NO PAST SURGERIES      Family History  Problem Relation Age of Onset  . Diabetes Mother   . Hypertension Mother   . Hypertension Father   . Cerebral palsy Sister   . Cancer Maternal Grandmother        lung ca  . Diabetes Maternal Grandmother   . Hypertension Maternal Grandmother   . Heart attack Maternal Grandfather   . Heart attack Paternal Grandmother   . Heart disease Paternal Grandmother   . Heart attack Paternal Grandfather   . Heart disease Paternal Grandfather     Social History  Substance Use Topics  . Smoking status: Former Smoker    Packs/day: 0.50    Types: Cigarettes    Quit date: 05/06/2016  . Smokeless tobacco: Never Used  . Alcohol use No    Allergies:  Allergies  Allergen Reactions  . Other Shortness Of Breath    Pecans  . Latex Itching    Latex     No prescriptions prior to admission.    Review of Systems  Constitutional: Negative for fever.  Gastrointestinal: Negative for abdominal pain, constipation, diarrhea, nausea and vomiting.  Genitourinary: Positive for vaginal  discharge. Negative for vaginal bleeding.   Physical Exam   Blood pressure 115/61, pulse 69, temperature 98.4 F (36.9 C), temperature source Oral, resp. rate 16, height 5\' 5"  (1.651 m), weight 192 lb 8 oz (87.3 kg), last menstrual period 04/10/2016, SpO2 99 %, not currently breastfeeding.  Physical Exam  Nursing note and vitals reviewed. Constitutional: She is oriented to person, place, and time. She appears well-developed and well-nourished. No distress.  HENT:  Head: Normocephalic and atraumatic.  Cardiovascular: Normal rate.   Respiratory: Effort normal.  GI: Soft. She exhibits no distension. There is no tenderness.  Genitourinary: Uterus is enlarged (gravid). Cervix exhibits no discharge and no friability. No bleeding in the vagina. Vaginal discharge (small amount of thin, white discharge noted. No pooling. ) found.  Neurological: She is alert and oriented to person, place, and time.  Skin: Skin is warm and dry. No erythema.  Psychiatric: She has a normal mood and affect.    Results for orders placed or performed during the hospital encounter of 08/15/16 (from the past 24 hour(s))  Urinalysis, Routine w reflex microscopic     Status: Abnormal   Collection Time: 08/15/16  8:00 AM  Result Value Ref Range   Color, Urine YELLOW YELLOW   APPearance CLEAR CLEAR   Specific Gravity, Urine 1.019 1.005 - 1.030   pH 5.0 5.0 - 8.0   Glucose, UA NEGATIVE NEGATIVE mg/dL   Hgb urine dipstick NEGATIVE NEGATIVE   Bilirubin Urine NEGATIVE NEGATIVE   Ketones, ur NEGATIVE NEGATIVE mg/dL   Protein, ur NEGATIVE NEGATIVE mg/dL   Nitrite NEGATIVE NEGATIVE   Leukocytes, UA TRACE (A) NEGATIVE   RBC / HPF 0-5 0 - 5 RBC/hpf   WBC, UA 6-30 0 - 5 WBC/hpf   Bacteria, UA NONE SEEN NONE SEEN   Squamous Epithelial / LPF 0-5 (A) NONE SEEN   Mucous PRESENT   Wet prep, genital     Status: Abnormal   Collection Time: 08/15/16  8:20 AM  Result Value Ref Range   Yeast Wet Prep HPF POC NONE SEEN NONE SEEN    Trich, Wet Prep NONE SEEN NONE SEEN   Clue Cells Wet Prep HPF POC NONE SEEN NONE SEEN   WBC, Wet Prep HPF POC FEW (A) NONE SEEN   Sperm NONE SEEN     MAU Course  Procedures None  MDM FHR - 140 bpm with doppler Fern - negative Wet prep today - normal No evidence of SROM today. Patient is stable and without pain, bleeding or continued LOF.  Discussed patient with Dr. Shawnie PonsPratt. No US is indicated today given exam findings. Patient may be discharged with precautions.  Assessment and Plan  A: SIUP at 4842w1d Vaginal discharge in pregnancy, second trimester   P:  Discharge home Second trimester precautions discussed Patient advised to return if LOF continues or worsens or if she were to develop fever Patient advised to follow-up with FT as scheduled for routine prenatal care or sooner PRN Patient may return to MAU as needed or if her condition were to change or worsen   Vonzella NippleJulie Sharrieff Spratlin, PA-C 08/15/2016, 10:17 AM

## 2016-08-15 NOTE — MAU Note (Signed)
Pt states last night about 7pm she noticed her underwear were wet. Pt states this morning when she woke up, she noted a large puddle of water. Pt states it did not have a smell. Pt denies bleeding and cramping.

## 2016-08-20 ENCOUNTER — Ambulatory Visit (INDEPENDENT_AMBULATORY_CARE_PROVIDER_SITE_OTHER): Payer: Medicaid Other | Admitting: Women's Health

## 2016-08-20 ENCOUNTER — Ambulatory Visit (INDEPENDENT_AMBULATORY_CARE_PROVIDER_SITE_OTHER): Payer: Medicaid Other

## 2016-08-20 ENCOUNTER — Encounter: Payer: Self-pay | Admitting: Women's Health

## 2016-08-20 VITALS — BP 100/56 | HR 72 | Wt 193.0 lb

## 2016-08-20 DIAGNOSIS — Z3402 Encounter for supervision of normal first pregnancy, second trimester: Secondary | ICD-10-CM

## 2016-08-20 DIAGNOSIS — Z363 Encounter for antenatal screening for malformations: Secondary | ICD-10-CM | POA: Diagnosis not present

## 2016-08-20 DIAGNOSIS — Z3482 Encounter for supervision of other normal pregnancy, second trimester: Secondary | ICD-10-CM

## 2016-08-20 DIAGNOSIS — Z331 Pregnant state, incidental: Secondary | ICD-10-CM

## 2016-08-20 DIAGNOSIS — Z1389 Encounter for screening for other disorder: Secondary | ICD-10-CM

## 2016-08-20 LAB — POCT URINALYSIS DIPSTICK
Blood, UA: NEGATIVE
Glucose, UA: NEGATIVE
Ketones, UA: NEGATIVE
Leukocytes, UA: NEGATIVE
Nitrite, UA: NEGATIVE
PROTEIN UA: NEGATIVE

## 2016-08-20 NOTE — Progress Notes (Signed)
Low-risk OB appointment G2P1001 574w6d Estimated Date of Delivery: 01/15/17 BP (!) 100/56   Pulse 72   Wt 193 lb (87.5 kg)   LMP 04/10/2016 (Exact Date)   BMI 32.12 kg/m   BP, weight, and urine reviewed.  Refer to obstetrical flow sheet for FH & FHR.  Reports good fm.  Denies regular uc's, lof, vb, or uti s/s. No complaints. Reviewed today's normal anatomy u/s, warning s/s to report. Plan:  Continue routine obstetrical care  F/U in 4wks for OB appointment

## 2016-08-20 NOTE — Progress Notes (Signed)
US 18+6 wks,cephalic,post pl gr 0,cx 4.4 cm, svp 4.9 cm,fhr 140 bpm,EFW 294g,anatomy complete,no obvious abnormalities seen

## 2016-08-20 NOTE — Patient Instructions (Signed)

## 2016-09-17 ENCOUNTER — Ambulatory Visit (INDEPENDENT_AMBULATORY_CARE_PROVIDER_SITE_OTHER): Payer: Medicaid Other | Admitting: Obstetrics and Gynecology

## 2016-09-17 ENCOUNTER — Encounter: Payer: Self-pay | Admitting: Obstetrics and Gynecology

## 2016-09-17 VITALS — BP 108/60 | HR 64 | Wt 201.0 lb

## 2016-09-17 DIAGNOSIS — Z3482 Encounter for supervision of other normal pregnancy, second trimester: Secondary | ICD-10-CM

## 2016-09-17 DIAGNOSIS — Z331 Pregnant state, incidental: Secondary | ICD-10-CM

## 2016-09-17 DIAGNOSIS — Z1389 Encounter for screening for other disorder: Secondary | ICD-10-CM

## 2016-09-17 LAB — POCT URINALYSIS DIPSTICK
GLUCOSE UA: NEGATIVE
Ketones, UA: NEGATIVE
Leukocytes, UA: NEGATIVE
NITRITE UA: NEGATIVE
PROTEIN UA: NEGATIVE
RBC UA: NEGATIVE

## 2016-09-17 NOTE — Patient Instructions (Signed)
(  [phone] is the phone number for Pregnancy Classes or hospital tours at [facility].  ° °You will be referred to  [email or web address]   for more information on childbirth classes   °At this site you may register for classes. You may sign up for a waiting list if classes are full. Please SIGN UP FOR THIS!.   When the waiting list becomes long, sometimes new classes can be added. ° ° ° °

## 2016-09-17 NOTE — Progress Notes (Signed)
Patient ID: Andrea Stephenson, female   DOB: 11/15/1990, 26 y.o.   MRN: [id number]  [id number]  [facility]  Chief Complaint  Patient presents with  . Routine Prenatal Visit  ____  Patient complaints: occasional lower abdominal pain, especially with quick movements.   Pt plans on breastfeeding, though was unsuccessful with last pregnancy d/t postnatal complications with the baby. She was induced at 41w and delivery required assistance of the KIWI.   She is planning on attending childbirth classes.   Patient reports good fetal movement. She denies any bleeding, rupture of membranes,or regular contractions.  Blood pressure 108/60, pulse 64, weight 201 lb (91.2 kg), last menstrual period 04/10/2016, not currently breastfeeding.   Urine results:notable for none refer to the ob flow sheet for FH and FHR, ,                          Physical Examination: General appearance - alert, well appearing, and in no distress                                      Abdomen - FH 24 cm                                                        -FHR 144 bpm                                                         soft, nontender, nondistended, no masses or organomegaly                                            Questions were answered. Assessment: LROB G2P1001 @ 4285w6d Abdominal pain consistent with gravid changes    Plan:  Continued routine obstetrical care  F/u in 4 weeks for routine prenatal care, pn2   By signing my name below, I, Doreatha MartinEva Mathews, attest that this documentation has been prepared under the direction and in the presence of Tilda BurrowFerguson, Olanda Downie V, MD. Electronically Signed: Doreatha MartinEva Mathews, ED Scribe. 09/17/16. 2:21 PM.  I personally performed the services described in this documentation, which was SCRIBED in my presence. The recorded information has been reviewed and considered accurate. It has been edited as necessary during review. Tilda BurrowFERGUSON,Ishani Goldwasser V, MD

## 2016-09-18 ENCOUNTER — Encounter: Payer: Self-pay | Admitting: Obstetrics and Gynecology

## 2016-10-15 ENCOUNTER — Encounter: Payer: Medicaid Other | Admitting: Advanced Practice Midwife

## 2016-10-15 ENCOUNTER — Other Ambulatory Visit: Payer: Medicaid Other

## 2016-10-29 ENCOUNTER — Ambulatory Visit (INDEPENDENT_AMBULATORY_CARE_PROVIDER_SITE_OTHER): Payer: Medicaid Other | Admitting: Advanced Practice Midwife

## 2016-10-29 ENCOUNTER — Encounter: Payer: Self-pay | Admitting: Advanced Practice Midwife

## 2016-10-29 ENCOUNTER — Other Ambulatory Visit: Payer: Medicaid Other

## 2016-10-29 VITALS — BP 100/58 | HR 70 | Wt 213.5 lb

## 2016-10-29 DIAGNOSIS — Z3A28 28 weeks gestation of pregnancy: Secondary | ICD-10-CM

## 2016-10-29 DIAGNOSIS — Z3483 Encounter for supervision of other normal pregnancy, third trimester: Secondary | ICD-10-CM

## 2016-10-29 DIAGNOSIS — Z331 Pregnant state, incidental: Secondary | ICD-10-CM

## 2016-10-29 DIAGNOSIS — Z1389 Encounter for screening for other disorder: Secondary | ICD-10-CM

## 2016-10-29 LAB — POCT URINALYSIS DIPSTICK
Glucose, UA: NEGATIVE
Ketones, UA: NEGATIVE
LEUKOCYTES UA: NEGATIVE
Nitrite, UA: NEGATIVE
PROTEIN UA: NEGATIVE
RBC UA: NEGATIVE

## 2016-10-29 NOTE — Progress Notes (Signed)
G2P1001 6263w6d Estimated Date of Delivery: 01/15/17  Blood pressure (!) 100/58, pulse 70, weight 96.8 kg (213 lb 8 oz), last menstrual period 04/10/2016, not currently breastfeeding.   BP weight and urine results all reviewed and noted.  Please refer to the obstetrical flow sheet for the fundal height and fetal heart rate documentation:  Patient reports good fetal movement, denies any bleeding and no rupture of membranes symptoms or regular contractions. Patient is without complaints. All questions were answered.  Orders Placed This Encounter  Procedures  . POCT urinalysis dipstick    Plan:  Continued routine obstetrical care, PN2 today (missed last appt)  Return in about 2 weeks (around 11/12/2016) for LROB.

## 2016-10-29 NOTE — Patient Instructions (Signed)

## 2016-10-30 LAB — RPR: RPR: NONREACTIVE

## 2016-10-30 LAB — CBC
Hematocrit: 32.1 % — ABNORMAL LOW (ref 34.0–46.6)
Hemoglobin: 10.8 g/dL — ABNORMAL LOW (ref 11.1–15.9)
MCH: 27 pg (ref 26.6–33.0)
MCHC: 33.6 g/dL (ref 31.5–35.7)
MCV: 80 fL (ref 79–97)
PLATELETS: 148 10*3/uL — AB (ref 150–379)
RBC: 4 x10E6/uL (ref 3.77–5.28)
RDW: 12.5 % (ref 12.3–15.4)
WBC: 8.9 10*3/uL (ref 3.4–10.8)

## 2016-10-30 LAB — GLUCOSE TOLERANCE, 2 HOURS W/ 1HR
GLUCOSE, 2 HOUR: 140 mg/dL (ref 65–152)
GLUCOSE, FASTING: 74 mg/dL (ref 65–91)
Glucose, 1 hour: 140 mg/dL (ref 65–179)

## 2016-10-30 LAB — HIV ANTIBODY (ROUTINE TESTING W REFLEX): HIV SCREEN 4TH GENERATION: NONREACTIVE

## 2016-10-30 LAB — ANTIBODY SCREEN: ANTIBODY SCREEN: NEGATIVE

## 2016-11-12 ENCOUNTER — Ambulatory Visit (INDEPENDENT_AMBULATORY_CARE_PROVIDER_SITE_OTHER): Payer: Medicaid Other | Admitting: Women's Health

## 2016-11-12 ENCOUNTER — Encounter: Payer: Self-pay | Admitting: Women's Health

## 2016-11-12 VITALS — BP 122/66 | HR 92 | Wt 218.0 lb

## 2016-11-12 DIAGNOSIS — O09893 Supervision of other high risk pregnancies, third trimester: Secondary | ICD-10-CM | POA: Diagnosis not present

## 2016-11-12 DIAGNOSIS — Z3A30 30 weeks gestation of pregnancy: Secondary | ICD-10-CM

## 2016-11-12 DIAGNOSIS — Z6791 Unspecified blood type, Rh negative: Secondary | ICD-10-CM

## 2016-11-12 DIAGNOSIS — O360130 Maternal care for anti-D [Rh] antibodies, third trimester, not applicable or unspecified: Secondary | ICD-10-CM | POA: Diagnosis not present

## 2016-11-12 DIAGNOSIS — Z3483 Encounter for supervision of other normal pregnancy, third trimester: Secondary | ICD-10-CM

## 2016-11-12 DIAGNOSIS — O26893 Other specified pregnancy related conditions, third trimester: Secondary | ICD-10-CM

## 2016-11-12 DIAGNOSIS — Z331 Pregnant state, incidental: Secondary | ICD-10-CM

## 2016-11-12 DIAGNOSIS — O26899 Other specified pregnancy related conditions, unspecified trimester: Secondary | ICD-10-CM

## 2016-11-12 DIAGNOSIS — Z1389 Encounter for screening for other disorder: Secondary | ICD-10-CM

## 2016-11-12 LAB — POCT URINALYSIS DIPSTICK
GLUCOSE UA: NEGATIVE
KETONES UA: NEGATIVE
Leukocytes, UA: NEGATIVE
Nitrite, UA: NEGATIVE
Protein, UA: NEGATIVE
RBC UA: NEGATIVE

## 2016-11-12 MED ORDER — RHO D IMMUNE GLOBULIN 1500 UNIT/2ML IJ SOSY
300.0000 ug | PREFILLED_SYRINGE | Freq: Once | INTRAMUSCULAR | Status: AC
  Administered 2016-11-12: 300 ug via INTRAMUSCULAR

## 2016-11-12 MED ORDER — FERROUS SULFATE 325 (65 FE) MG PO TABS: 325.0000 mg | ORAL_TABLET | Freq: Two times a day (BID) | ORAL | 3 refills | Status: DC

## 2016-11-12 NOTE — Progress Notes (Signed)
Low-risk OB appointment G2P1001 5955w6d Estimated Date of Delivery: 01/15/17 BP 122/66   Pulse 92   Wt 218 lb (98.9 kg)   LMP 04/10/2016 (Exact Date)   BMI 36.28 kg/m   BP, weight, and urine reviewed.  Refer to obstetrical flow sheet for FH & FHR.  Reports good fm.  Denies regular uc's, lof, vb, or uti s/s. No complaints. Reviewed ptl s/s, fkc, pn2 results- anemic, rx'd fe bid, increase fe-rich foods. Recommended Tdap at HD/PCP per CDC guidelines.  Plan:  Continue routine obstetrical care  F/U in 2wks for OB appointment  Rhogam today

## 2016-11-12 NOTE — Progress Notes (Signed)
Pt given Rhogam Right VG without complications.

## 2016-11-12 NOTE — Patient Instructions (Addendum)
Call the office ([phone]) or go to [facility] if:  You begin to have strong, frequent contractions  Your water breaks.  Sometimes it is a big gush of fluid, sometimes it is just a trickle that keeps getting your panties wet or running down your legs  You have vaginal bleeding.  It is normal to have a small amount of spotting if your cervix was checked.   You don't feel your baby moving like normal.  If you don't, get you something to eat and drink and lay down and focus on feeling your baby move.  You should feel at least 10 movements in 2 hours.  If you don't, you should call the office or go to [facility].    Tdap Vaccine  It is recommended that you get the Tdap vaccine during the third trimester of EACH pregnancy to help protect your baby from getting pertussis (whooping cough)  27-36 weeks is the BEST time to do this so that you can pass the protection on to your baby. During pregnancy is better than after pregnancy, but if you are unable to get it during pregnancy it will be offered at the hospital.   You can get this vaccine at the health department or your family doctor  Everyone who will be around your baby should also be up-to-date on their vaccines. Adults (who are not pregnant) only need 1 dose of Tdap during adulthood.   Third Trimester of Pregnancy The third trimester is from week 29 through week 42, months 7 through 9. The third trimester is a time when the fetus is growing rapidly. At the end of the ninth month, the fetus is about 20 inches in length and weighs 6-10 pounds.  BODY CHANGES Your body goes through many changes during pregnancy. The changes vary from woman to woman.   Your weight will continue to increase. You can expect to gain 25-35 pounds (11-16 kg) by the end of the pregnancy.  You may begin to get stretch marks on your hips, abdomen, and breasts.  You may urinate more often because the fetus is moving lower into your pelvis and pressing on  your bladder.  You may develop or continue to have heartburn as a result of your pregnancy.  You may develop constipation because certain hormones are causing the muscles that push waste through your intestines to slow down.  You may develop hemorrhoids or swollen, bulging veins (varicose veins).  You may have pelvic pain because of the weight gain and pregnancy hormones relaxing your joints between the bones in your pelvis. Backaches may result from overexertion of the muscles supporting your posture.  You may have changes in your hair. These can include thickening of your hair, rapid growth, and changes in texture. Some women also have hair loss during or after pregnancy, or hair that feels dry or thin. Your hair will most likely return to normal after your baby is born.  Your breasts will continue to grow and be tender. A yellow discharge may leak from your breasts called colostrum.  Your belly button may stick out.  You may feel short of breath because of your expanding uterus.  You may notice the fetus "dropping," or moving lower in your abdomen.  You may have a bloody mucus discharge. This usually occurs a few days to a week before labor begins.  Your cervix becomes thin and soft (effaced) near your due date. WHAT TO EXPECT AT YOUR PRENATAL EXAMS  You will have prenatal   exams every 2 weeks until week 36. Then, you will have weekly prenatal exams. During a routine prenatal visit:  You will be weighed to make sure you and the fetus are growing normally.  Your blood pressure is taken.  Your abdomen will be measured to track your baby's growth.  The fetal heartbeat will be listened to.  Any test results from the previous visit will be discussed.  You may have a cervical check near your due date to see if you have effaced. At around 36 weeks, your caregiver will check your cervix. At the same time, your caregiver will also perform a test on the secretions of the vaginal tissue.  This test is to determine if a type of bacteria, Group B streptococcus, is present. Your caregiver will explain this further. Your caregiver may ask you:  What your birth plan is.  How you are feeling.  If you are feeling the baby move.  If you have had any abnormal symptoms, such as leaking fluid, bleeding, severe headaches, or abdominal cramping.  If you have any questions. Other tests or screenings that may be performed during your third trimester include:  Blood tests that check for low iron levels (anemia).  Fetal testing to check the health, activity level, and growth of the fetus. Testing is done if you have certain medical conditions or if there are problems during the pregnancy. FALSE LABOR You may feel small, irregular contractions that eventually go away. These are called Braxton Hicks contractions, or false labor. Contractions may last for hours, days, or even weeks before true labor sets in. If contractions come at regular intervals, intensify, or become painful, it is best to be seen by your caregiver.  SIGNS OF LABOR   Menstrual-like cramps.  Contractions that are 5 minutes apart or less.  Contractions that start on the top of the uterus and spread down to the lower abdomen and back.  A sense of increased pelvic pressure or back pain.  A watery or bloody mucus discharge that comes from the vagina. If you have any of these signs before the 37th week of pregnancy, call your caregiver right away. You need to go to the hospital to get checked immediately. HOME CARE INSTRUCTIONS   Avoid all smoking, herbs, alcohol, and unprescribed drugs. These chemicals affect the formation and growth of the baby.  Follow your caregiver's instructions regarding medicine use. There are medicines that are either safe or unsafe to take during pregnancy.  Exercise only as directed by your caregiver. Experiencing uterine cramps is a good sign to stop exercising.  Continue to eat regular,  healthy meals.  Wear a good support bra for breast tenderness.  Do not use hot tubs, steam rooms, or saunas.  Wear your seat belt at all times when driving.  Avoid raw meat, uncooked cheese, cat litter boxes, and soil used by cats. These carry germs that can cause birth defects in the baby.  Take your prenatal vitamins.  Try taking a stool softener (if your caregiver approves) if you develop constipation. Eat more high-fiber foods, such as fresh vegetables or fruit and whole grains. Drink plenty of fluids to keep your urine clear or pale yellow.  Take warm sitz baths to soothe any pain or discomfort caused by hemorrhoids. Use hemorrhoid cream if your caregiver approves.  If you develop varicose veins, wear support hose. Elevate your feet for 15 minutes, 3-4 times a day. Limit salt in your diet.  Avoid heavy lifting, wear low heal  shoes, and practice good posture.  Rest a lot with your legs elevated if you have leg cramps or low back pain.  Visit your dentist if you have not gone during your pregnancy. Use a soft toothbrush to brush your teeth and be gentle when you floss.  A sexual relationship may be continued unless your caregiver directs you otherwise.  Do not travel far distances unless it is absolutely necessary and only with the approval of your caregiver.  Take prenatal classes to understand, practice, and ask questions about the labor and delivery.  Make a trial run to the hospital.  Pack your hospital bag.  Prepare the baby's nursery.  Continue to go to all your prenatal visits as directed by your caregiver. SEEK MEDICAL CARE IF:  You are unsure if you are in labor or if your water has broken.  You have dizziness.  You have mild pelvic cramps, pelvic pressure, or nagging pain in your abdominal area.  You have persistent nausea, vomiting, or diarrhea.  You have a bad smelling vaginal discharge.  You have pain with urination. SEEK IMMEDIATE MEDICAL CARE IF:    You have a fever.  You are leaking fluid from your vagina.  You have spotting or bleeding from your vagina.  You have severe abdominal cramping or pain.  You have rapid weight loss or gain.  You have shortness of breath with chest pain.  You notice sudden or extreme swelling of your face, hands, ankles, feet, or legs.  You have not felt your baby move in over an hour.  You have severe headaches that do not go away with medicine.  You have vision changes. Document Released: 03/18/2001 Document Revised: 03/29/2013 Document Reviewed: 05/25/2012 [facility] Patient Information 2015 [place], [place]. This information is not intended to replace advice given to you by your health care provider. Make sure you discuss any questions you have with your health care provider.   Iron-Rich Diet Iron is a mineral that helps your body to produce hemoglobin. Hemoglobin is a protein in your red blood cells that carries oxygen to your body's tissues. Eating too little iron may cause you to feel weak and tired, and it can increase your risk for infection. Eating enough iron is necessary for your body's metabolism, muscle function, and nervous system. Iron is naturally found in many foods. It can also be added to foods or fortified in foods. There are two types of dietary iron:  Heme iron. Heme iron is absorbed by the body more easily than nonheme iron. Heme iron is found in meat, poultry, and fish.  Nonheme iron. Nonheme iron is found in dietary supplements, iron-fortified grains, beans, and vegetables.  You may need to follow an iron-rich diet if:  You have been diagnosed with iron deficiency or iron-deficiency anemia.  You have a condition that prevents you from absorbing dietary iron, such as: ? Infection in your intestines. ? Celiac disease. This involves long-lasting (chronic) inflammation of your intestines.  You do not eat enough iron.  You eat a diet that is high in foods that impair iron  absorption.  You have lost a lot of blood.  You have heavy bleeding during your menstrual cycle.  You are pregnant.  What is my plan? Your health care provider may help you to determine how much iron you need per day based on your condition. Generally, when a person consumes sufficient amounts of iron in the diet, the following iron needs are met:  Men. ? 14-18 years  old: 11 mg per day. ? 29-50 years old: 8 mg per day.  Women. ? 32-80 years old: 15 mg per day. ? 31-65 years old: 18 mg per day. ? Over 32 years old: 8 mg per day. ? Pregnant women: 27 mg per day. ? Breastfeeding women: 9 mg per day.  What do I need to know about an iron-rich diet?  Eat fresh fruits and vegetables that are high in vitamin C along with foods that are high in iron. This will help increase the amount of iron that your body absorbs from food, especially with foods containing nonheme iron. Foods that are high in vitamin C include oranges, peppers, tomatoes, and mango.  Take iron supplements only as directed by your health care provider. Overdose of iron can be life-threatening. If you were prescribed iron supplements, take them with orange juice or a vitamin C supplement.  Cook foods in pots and pans that are made from iron.  Eat nonheme iron-containing foods alongside foods that are high in heme iron. This helps to improve your iron absorption.  Certain foods and drinks contain compounds that impair iron absorption. Avoid eating these foods in the same meal as iron-rich foods or with iron supplements. These include: ? Coffee, black tea, and red wine. ? Milk, dairy products, and foods that are high in calcium. ? Beans, soybeans, and peas. ? Whole grains.  When eating foods that contain both nonheme iron and compounds that impair iron absorption, follow these tips to absorb iron better. ? Soak beans overnight before cooking. ? Soak whole grains overnight and drain them before using. ? Ferment flours  before baking, such as using yeast in bread dough. What foods can I eat? Grains Iron-fortified breakfast cereal. Iron-fortified whole-wheat bread. Enriched rice. Sprouted grains. Vegetables Spinach. Potatoes with skin. Green peas. Broccoli. Red and green bell peppers. Fermented vegetables. Fruits Prunes. Raisins. Oranges. Strawberries. Mango. Grapefruit. Meats and Other Protein Sources Beef liver. Oysters. Beef. Shrimp. [place]. Chicken. [place]. Sardines. Chickpeas. Nuts. Tofu. Beverages Tomato juice. Fresh orange juice. Prune juice. Hibiscus tea. Fortified instant breakfast shakes. Condiments Tahini. Fermented soy sauce. Sweets and Desserts Black-strap molasses. Other Wheat germ. The items listed above may not be a complete list of recommended foods or beverages. Contact your dietitian for more options. What foods are not recommended? Grains Whole grains. Bran cereal. Bran flour. Oats. Vegetables Artichokes. Brussels sprouts. Kale. Fruits Blueberries. Raspberries. Strawberries. Figs. Meats and Other Protein Sources Soybeans. Products made from soy protein. Dairy Milk. Cream. Cheese. Yogurt. Cottage cheese. Beverages Coffee. Black tea. Red wine. Sweets and Desserts Cocoa. Chocolate. Ice cream. Other Basil. Oregano. Parsley. The items listed above may not be a complete list of foods and beverages to avoid. Contact your dietitian for more information. This information is not intended to replace advice given to you by your health care provider. Make sure you discuss any questions you have with your health care provider. Document Released: 11/05/2004 Document Revised: 10/12/2015 Document Reviewed: 10/19/2013 Elsevier Interactive Patient Education  [employer].

## 2016-11-26 ENCOUNTER — Ambulatory Visit (INDEPENDENT_AMBULATORY_CARE_PROVIDER_SITE_OTHER): Payer: Medicaid Other | Admitting: Obstetrics and Gynecology

## 2016-11-26 ENCOUNTER — Encounter: Payer: Self-pay | Admitting: Obstetrics and Gynecology

## 2016-11-26 VITALS — BP 110/68 | HR 106 | Wt 220.0 lb

## 2016-11-26 DIAGNOSIS — Z331 Pregnant state, incidental: Secondary | ICD-10-CM

## 2016-11-26 DIAGNOSIS — Z3483 Encounter for supervision of other normal pregnancy, third trimester: Secondary | ICD-10-CM

## 2016-11-26 DIAGNOSIS — Z3A32 32 weeks gestation of pregnancy: Secondary | ICD-10-CM

## 2016-11-26 DIAGNOSIS — Z1389 Encounter for screening for other disorder: Secondary | ICD-10-CM

## 2016-11-26 LAB — POCT URINALYSIS DIPSTICK
Blood, UA: NEGATIVE
Glucose, UA: NEGATIVE
KETONES UA: NEGATIVE
Nitrite, UA: NEGATIVE
PROTEIN UA: NEGATIVE

## 2016-11-26 NOTE — Progress Notes (Signed)
Patient ID: Andrea Stephenson, female   DOB: 05-Oct-1990, 26 y.o.   MRN: [id number] G2P1001 [redacted]w[redacted]d Estimated Date of Delivery: 01/15/17 LROB  Patient reports good fetal movement, denies any bleeding and no rupture of membranes symptoms or regular contractions. Patient complaints: no complaints at this time. She notes that she is unsure of her birth control method following delivery. Pt reports that she used OCP following her last pregnancy and she didn't do as well with taking it daily. Denies any other symptoms.   Blood pressure 110/68, pulse (!) 106, weight 220 lb (99.8 kg), last menstrual period 04/10/2016, not currently breastfeeding. refer to the ob flow sheet for FH and FHR, also BP, Wt, Urine results:notable for 2+ leukocytes, otherwise negative                          Physical Examination: General appearance - alert, well appearing, and in no distress Abdomen - FH 35 cm                   -FHR 161 soft, nontender, nondistended, no masses or organomegaly                                            Questions were answered. Assessment: LROB G2P1001 @ [redacted]w[redacted]d              Contraception management pills  Plan:  Continued routine obstetrical care  F/u in 2 weeks for LROB  By signing my name below, I, Soijett Blue, attest that this documentation has been prepared under the direction and in the presence of Tilda Burrow, MD. Electronically Signed: Soijett Blue, ED Scribe. 11/26/16. 2:54 PM.  I personally performed the services described in this documentation, which was SCRIBED in my presence. The recorded information has been reviewed and considered accurate. It has been edited as necessary during review. Tilda Burrow, MD

## 2016-12-10 ENCOUNTER — Ambulatory Visit (INDEPENDENT_AMBULATORY_CARE_PROVIDER_SITE_OTHER): Payer: Medicaid Other | Admitting: Women's Health

## 2016-12-10 ENCOUNTER — Encounter: Payer: Self-pay | Admitting: Women's Health

## 2016-12-10 VITALS — BP 102/64 | HR 80 | Wt 225.0 lb

## 2016-12-10 DIAGNOSIS — O368130 Decreased fetal movements, third trimester, not applicable or unspecified: Secondary | ICD-10-CM

## 2016-12-10 DIAGNOSIS — Z1389 Encounter for screening for other disorder: Secondary | ICD-10-CM

## 2016-12-10 DIAGNOSIS — Z3A34 34 weeks gestation of pregnancy: Secondary | ICD-10-CM | POA: Diagnosis not present

## 2016-12-10 DIAGNOSIS — Z331 Pregnant state, incidental: Secondary | ICD-10-CM

## 2016-12-10 DIAGNOSIS — Z3483 Encounter for supervision of other normal pregnancy, third trimester: Secondary | ICD-10-CM

## 2016-12-10 LAB — POCT URINALYSIS DIPSTICK
Blood, UA: NEGATIVE
GLUCOSE UA: NEGATIVE
Ketones, UA: NEGATIVE
NITRITE UA: NEGATIVE
PROTEIN UA: NEGATIVE

## 2016-12-10 NOTE — Patient Instructions (Signed)
Call the office [phone]) or [facility] to [facility] if:  You begin to have strong, frequent contractions  Your water breaks.  Sometimes it is a big gush of fluid, sometimes it is just a trickle that keeps getting your panties wet or running down your legs  You have vaginal bleeding.  It is normal to have a small amount of spotting if your cervix was checked.   You don't feel your baby moving like normal.  If you don't, get you something to eat and drink and lay down and focus on feeling your baby move.  You should feel at least 10 movements in 2 hours.  If you don't, you should call the office or [facility].   Tips to Help You Sleep Better:   Get into a bedtime routine, try to do the same thing every night before going to bed to try to help your body wind down  Warm baths  Avoid caffeine for at least 3 hours before going to sleep   Keep your room at a slightly cooler temperature, can try running a fan  Turn off TV, lights, phone, electronics  Lots of pillows if needed to help you get comfortable  Lavender scented items can help you sleep. You can place lavender essential oil on a cotton ball and place under your pillowcase, or place in a diffuser. [employer] has a lavender scented sleep line (plug-ins, sprays, etc). Look in the pillow aisle for lavender scented pillows.   If none of the above things help, you can try 1/2 to 1 tablet of benadryl, unisom, or tylenol pm. Do not take this every night, only when you really need it.    Preterm Labor and Birth Information The normal length of a pregnancy is 39-41 weeks. Preterm labor is when labor starts before 37 completed weeks of pregnancy. What are the risk factors for preterm labor? Preterm labor is more likely to occur in women who:  Have certain infections during pregnancy such as a bladder infection, sexually transmitted infection, or infection inside the uterus (chorioamnionitis).  Have a shorter-than-normal  cervix.  Have gone into preterm labor before.  Have had surgery on their cervix.  Are younger than age 25 or older than age 25.  Are African American.  Are pregnant with twins or multiple babies (multiple gestation).  Take street drugs or smoke while pregnant.  Do not gain enough weight while pregnant.  Became pregnant shortly after having been pregnant.  What are the symptoms of preterm labor? Symptoms of preterm labor include:  Cramps similar to those that can happen during a menstrual period. The cramps may happen with diarrhea.  Pain in the abdomen or lower back.  Regular uterine contractions that may feel like tightening of the abdomen.  A feeling of increased pressure in the pelvis.  Increased watery or bloody mucus discharge from the vagina.  Water breaking (ruptured amniotic sac).  Why is it important to recognize signs of preterm labor? It is important to recognize signs of preterm labor because babies who are born prematurely may not be fully developed. This can put them at an increased risk for:  Long-term (chronic) heart and lung problems.  Difficulty immediately after birth with regulating body systems, including blood sugar, body temperature, heart rate, and breathing rate.  Bleeding in the brain.  Cerebral palsy.  Learning difficulties.  Death.  These risks are highest for babies who are born before 34 weeks of pregnancy. How is preterm labor treated?  Treatment depends on the length of your pregnancy, your condition, and the health of your baby. It may involve:  Having a stitch (suture) placed in your cervix to prevent your cervix from opening too early (cerclage).  Taking or being given medicines, such as: ? Hormone medicines. These may be given early in pregnancy to help support the pregnancy. ? Medicine to stop contractions. ? Medicines to help mature the baby's lungs. These may be prescribed if the risk of delivery is high. ? Medicines to  prevent your baby from developing cerebral palsy.  If the labor happens before 34 weeks of pregnancy, you may need to stay in the hospital. What should I do if I think I am in preterm labor? If you think that you are going into preterm labor, call your health care provider right away. How can I prevent preterm labor in future pregnancies? To increase your chance of having a full-term pregnancy:  Do not use any tobacco products, such as cigarettes, chewing tobacco, and e-cigarettes. If you need help quitting, ask your health care provider.  Do not use street drugs or medicines that have not been prescribed to you during your pregnancy.  Talk with your health care provider before taking any herbal supplements, even if you have been taking them regularly.  Make sure you gain a healthy amount of weight during your pregnancy.  Watch for infection. If you think that you might have an infection, get it checked right away.  Make sure to tell your health care provider if you have gone into preterm labor before.  This information is not intended to replace advice given to you by your health care provider. Make sure you discuss any questions you have with your health care provider. Document Released: 06/14/2003 Document Revised: 09/04/2015 Document Reviewed: 08/15/2015 Elsevier Interactive Patient Education  2018 [employer].

## 2016-12-10 NOTE — Progress Notes (Signed)
Low-risk OB appointment G2P1001 4947w6d Estimated Date of Delivery: 01/15/17 BP 102/64   Pulse 80   Wt 225 lb (102.1 kg)   LMP 04/10/2016 (Exact Date)   BMI 37.44 kg/m   BP, weight, and urine reviewed.  Refer to obstetrical flow sheet for FH & FHR.  Reports decreased fm.  Denies regular uc's, lof, vb, or uti s/s.  Placed on EFM for NST: reactive, pt felt lots of fm while on EFM Reviewed ptl s/s, fkc. Plan:  Continue routine obstetrical care  F/U in 1wk for OB appointment

## 2016-12-17 ENCOUNTER — Ambulatory Visit (INDEPENDENT_AMBULATORY_CARE_PROVIDER_SITE_OTHER): Payer: Medicaid Other | Admitting: Advanced Practice Midwife

## 2016-12-17 ENCOUNTER — Encounter: Payer: Self-pay | Admitting: Advanced Practice Midwife

## 2016-12-17 VITALS — BP 100/70 | HR 80 | Wt 227.0 lb

## 2016-12-17 DIAGNOSIS — Z3483 Encounter for supervision of other normal pregnancy, third trimester: Secondary | ICD-10-CM

## 2016-12-17 DIAGNOSIS — Z3A35 35 weeks gestation of pregnancy: Secondary | ICD-10-CM

## 2016-12-17 DIAGNOSIS — Z331 Pregnant state, incidental: Secondary | ICD-10-CM

## 2016-12-17 DIAGNOSIS — Z1389 Encounter for screening for other disorder: Secondary | ICD-10-CM

## 2016-12-17 LAB — POCT URINALYSIS DIPSTICK
Blood, UA: NEGATIVE
GLUCOSE UA: NEGATIVE
Ketones, UA: NEGATIVE
LEUKOCYTES UA: NEGATIVE
Nitrite, UA: NEGATIVE

## 2016-12-17 NOTE — Progress Notes (Signed)
G2P1001 9370w6d Estimated Date of Delivery: 01/15/17  Blood pressure 100/70, pulse 80, weight 227 lb (103 kg), last menstrual period 04/10/2016, not currently breastfeeding.   BP weight and urine results all reviewed and noted.  Please refer to the obstetrical flow sheet for the fundal height and fetal heart rate documentation:  Patient reports good fetal movement, denies any bleeding and no rupture of membranes symptoms or regular contractions. Patient is without complaints. All questions were answered.  Orders Placed This Encounter  Procedures  . POCT urinalysis dipstick    Plan:  Continued routine obstetrical care,   Return in about 1 week (around 12/24/2016) for LROB.

## 2016-12-24 ENCOUNTER — Encounter: Payer: Self-pay | Admitting: Advanced Practice Midwife

## 2016-12-24 ENCOUNTER — Ambulatory Visit (INDEPENDENT_AMBULATORY_CARE_PROVIDER_SITE_OTHER): Payer: Medicaid Other | Admitting: Advanced Practice Midwife

## 2016-12-24 VITALS — BP 128/68 | HR 107 | Wt 230.0 lb

## 2016-12-24 DIAGNOSIS — Z3A36 36 weeks gestation of pregnancy: Secondary | ICD-10-CM

## 2016-12-24 DIAGNOSIS — Z331 Pregnant state, incidental: Secondary | ICD-10-CM

## 2016-12-24 DIAGNOSIS — Z1389 Encounter for screening for other disorder: Secondary | ICD-10-CM

## 2016-12-24 DIAGNOSIS — Z3483 Encounter for supervision of other normal pregnancy, third trimester: Secondary | ICD-10-CM

## 2016-12-24 LAB — POCT URINALYSIS DIPSTICK
GLUCOSE UA: NEGATIVE
Ketones, UA: NEGATIVE
Nitrite, UA: NEGATIVE
Protein, UA: NEGATIVE
RBC UA: NEGATIVE

## 2016-12-24 NOTE — Progress Notes (Signed)
G2P1001 [redacted]w[redacted]d Estimated Date of Delivery: 01/15/17  Blood pressure 128/68, pulse (!) 107, weight 230 lb (104.3 kg), last menstrual period 04/10/2016, not currently breastfeeding.   BP weight and urine results all reviewed and noted.  Please refer to the obstetrical flow sheet for the fundal height and fetal heart rate documentation:  Patient reports good fetal movement, denies any bleeding and no rupture of membranes symptoms or regular contractions. Patient is without complaints. All questions were answered.  Orders Placed This Encounter  Procedures  . GC/Chlamydia Probe Amp  . Strep Gp B NAA  . POCT Urinalysis Dipstick    Plan:  Continued routine obstetrical care,   Return in about 1 week (around 12/31/2016) for LROB.

## 2016-12-27 LAB — STREP GP B NAA: Strep Gp B NAA: NEGATIVE

## 2016-12-28 LAB — GC/CHLAMYDIA PROBE AMP
CHLAMYDIA, DNA PROBE: NEGATIVE
NEISSERIA GONORRHOEAE BY PCR: NEGATIVE

## 2016-12-31 ENCOUNTER — Ambulatory Visit (INDEPENDENT_AMBULATORY_CARE_PROVIDER_SITE_OTHER): Payer: Medicaid Other | Admitting: Women's Health

## 2016-12-31 ENCOUNTER — Encounter: Payer: Self-pay | Admitting: Women's Health

## 2016-12-31 VITALS — BP 110/68 | HR 105 | Wt 234.5 lb

## 2016-12-31 DIAGNOSIS — Z331 Pregnant state, incidental: Secondary | ICD-10-CM

## 2016-12-31 DIAGNOSIS — Z3483 Encounter for supervision of other normal pregnancy, third trimester: Secondary | ICD-10-CM

## 2016-12-31 DIAGNOSIS — Z1389 Encounter for screening for other disorder: Secondary | ICD-10-CM

## 2016-12-31 DIAGNOSIS — Z3A37 37 weeks gestation of pregnancy: Secondary | ICD-10-CM

## 2016-12-31 LAB — POCT URINALYSIS DIPSTICK
GLUCOSE UA: NEGATIVE
Ketones, UA: NEGATIVE
NITRITE UA: NEGATIVE
Protein, UA: NEGATIVE
RBC UA: NEGATIVE

## 2016-12-31 NOTE — Progress Notes (Signed)
   [facility] Low-Risk Pregnancy Visit  Patient name: Andrea Stephenson MRN [id number]  Date of birth: 1990-04-28 CC & HPI:  Jaloni Sorber is a 26 y.o. G2P1001 female at [redacted]w[redacted]d with an Estimated Date of Delivery: 01/15/17 being seen today for ongoing management of a low-risk pregnancy.   Today she reports no complaints  Review of Systems:   Reports good fm.   Denies regular uc's, lof, vb, or uti s/s. No complaints.  Pertinent History Reviewed:  Reviewed past medical,surgical and family history.  Reviewed problem list, medications and allergies.  Objective Findings:   Vitals:   12/31/16 1512  BP: 110/68  Pulse: (!) 105  Weight: 234 lb 8 oz (106.4 kg)    Body mass index is 39.02 kg/m.  Fundal height: 37 Fetal heart rate: 151 SVE per request: 1.5/th/-3, vtx  Results for orders placed or performed in visit on 12/31/16 (from the past 24 hour(s))  POCT Urinalysis Dipstick   Collection Time: 12/31/16  3:13 PM  Result Value Ref Range   Color, UA     Clarity, UA     Glucose, UA neg    Bilirubin, UA     Ketones, UA neg    Spec Grav, UA  1.010 - 1.025   Blood, UA neg    pH, UA  5.0 - 8.0   Protein, UA neg    Urobilinogen, UA  0.2 or 1.0 E.U./dL   Nitrite, UA neg    Leukocytes, UA Trace (A) Negative     Assessment & Plan:   1) Low-risk pregnancy G2P1001 at [redacted]w[redacted]d with an Estimated Date of Delivery: 01/15/17   Reviewed: labor s/s, fkc. All questions were answered  Plan:  Continue routine obstetrical care   Return in about 1 week (around 01/07/2017) for LROB.  Orders Placed This Encounter  Procedures  . POCT Urinalysis Dipstick    Marge Duncans CNM, [facility] 12/31/2016 3:39 PM

## 2016-12-31 NOTE — Patient Instructions (Signed)
Call the office ([phone]) or go to [facility] if:  You begin to have strong, frequent contractions  Your water breaks.  Sometimes it is a big gush of fluid, sometimes it is just a trickle that keeps getting your panties wet or running down your legs  You have vaginal bleeding.  It is normal to have a small amount of spotting if your cervix was checked.   You don't feel your baby moving like normal.  If you don't, get you something to eat and drink and lay down and focus on feeling your baby move.  You should feel at least 10 movements in 2 hours.  If you don't, you should call the office or go to [facility].     Braxton Hicks Contractions Contractions of the uterus can occur throughout pregnancy, but they are not always a sign that you are in labor. You may have practice contractions called Braxton Hicks contractions. These false labor contractions are sometimes confused with true labor. What are Braxton Hicks contractions? Braxton Hicks contractions are tightening movements that occur in the muscles of the uterus before labor. Unlike true labor contractions, these contractions do not result in opening (dilation) and thinning of the cervix. Toward the end of pregnancy (32-34 weeks), Braxton Hicks contractions can happen more often and may become stronger. These contractions are sometimes difficult to tell apart from true labor because they can be very uncomfortable. You should not feel embarrassed if you go to the hospital with false labor. Sometimes, the only way to tell if you are in true labor is for your health care provider to look for changes in the cervix. The health care provider will do a physical exam and may monitor your contractions. If you are not in true labor, the exam should show that your cervix is not dilating and your water has not broken. If there are no prenatal problems or other health problems associated with your pregnancy, it is completely safe for you to be sent  home with false labor. You may continue to have Braxton Hicks contractions until you go into true labor. How can I tell the difference between true labor and false labor?  Differences ? False labor ? Contractions last 30-70 seconds.: Contractions are usually shorter and not as strong as true labor contractions. ? Contractions become very regular.: Contractions are usually irregular. ? Discomfort is usually felt in the top of the uterus, and it spreads to the lower abdomen and low back.: Contractions are often felt in the front of the lower abdomen and in the groin. ? Contractions do not go away with walking.: Contractions may go away when you walk around or change positions while lying down. ? Contractions usually become more intense and increase in frequency.: Contractions get weaker and are shorter-lasting as time goes on. ? The cervix dilates and gets thinner.: The cervix usually does not dilate or become thin. Follow these instructions at home:  Take over-the-counter and prescription medicines only as told by your health care provider.  Keep up with your usual exercises and follow other instructions from your health care provider.  Eat and drink lightly if you think you are going into labor.  If Braxton Hicks contractions are making you uncomfortable: ? Change your position from lying down or resting to walking, or change from walking to resting. ? Sit and rest in a tub of warm water. ? Drink enough fluid to keep your urine clear or pale yellow. Dehydration may cause these contractions. ?   Do slow and deep breathing several times an hour.  Keep all follow-up prenatal visits as told by your health care provider. This is important. Contact a health care provider if:  You have a fever.  You have continuous pain in your abdomen. Get help right away if:  Your contractions become stronger, more regular, and closer together.  You have fluid leaking or gushing from your vagina.  You  pass blood-tinged mucus (bloody show).  You have bleeding from your vagina.  You have low back pain that you never had before.  You feel your baby's head pushing down and causing pelvic pressure.  Your baby is not moving inside you as much as it used to. Summary  Contractions that occur before labor are called Braxton Hicks contractions, false labor, or practice contractions.  Braxton Hicks contractions are usually shorter, weaker, farther apart, and less regular than true labor contractions. True labor contractions usually become progressively stronger and regular and they become more frequent.  Manage discomfort from Braxton Hicks contractions by changing position, resting in a warm bath, drinking plenty of water, or practicing deep breathing. This information is not intended to replace advice given to you by your health care provider. Make sure you discuss any questions you have with your health care provider. Document Released: 03/24/2005 Document Revised: 02/11/2016 Document Reviewed: 02/11/2016 Elsevier Interactive Patient Education  2017 [employer]  

## 2017-01-07 ENCOUNTER — Ambulatory Visit (INDEPENDENT_AMBULATORY_CARE_PROVIDER_SITE_OTHER): Payer: Medicaid Other | Admitting: Advanced Practice Midwife

## 2017-01-07 ENCOUNTER — Encounter: Payer: Self-pay | Admitting: Advanced Practice Midwife

## 2017-01-07 VITALS — BP 128/90 | HR 82 | Wt 237.0 lb

## 2017-01-07 DIAGNOSIS — Z3483 Encounter for supervision of other normal pregnancy, third trimester: Secondary | ICD-10-CM

## 2017-01-07 DIAGNOSIS — Z1389 Encounter for screening for other disorder: Secondary | ICD-10-CM

## 2017-01-07 DIAGNOSIS — Z331 Pregnant state, incidental: Secondary | ICD-10-CM

## 2017-01-07 DIAGNOSIS — Z3A38 38 weeks gestation of pregnancy: Secondary | ICD-10-CM

## 2017-01-07 LAB — POCT URINALYSIS DIPSTICK
GLUCOSE UA: NEGATIVE
Ketones, UA: NEGATIVE
Nitrite, UA: NEGATIVE
Protein, UA: NEGATIVE
RBC UA: NEGATIVE

## 2017-01-07 NOTE — Progress Notes (Signed)
G2P1001 [redacted]w[redacted]d Estimated Date of Delivery: 01/15/17  Blood pressure 128/90, pulse 82, weight 237 lb (107.5 kg), last menstrual period 04/10/2016, not currently breastfeeding.   BP weight and urine results all reviewed and noted. No HA, vision changes, RUQ pain  Please refer to the obstetrical flow sheet for the fundal height and fetal heart rate documentation:  Patient reports good fetal movement, denies any bleeding and no rupture of membranes symptoms or regular contractions. Patient is without complaints. All questions were answered.  Orders Placed This Encounter  Procedures  . POCT urinalysis dipstick    Plan:  Continued routine obstetrical care,   Return in about 2 days (around 01/09/2017) for LROB/BP CHECK.

## 2017-01-09 ENCOUNTER — Encounter: Payer: Self-pay | Admitting: Obstetrics & Gynecology

## 2017-01-09 ENCOUNTER — Ambulatory Visit (INDEPENDENT_AMBULATORY_CARE_PROVIDER_SITE_OTHER): Payer: Medicaid Other | Admitting: Obstetrics & Gynecology

## 2017-01-09 VITALS — BP 134/88 | HR 86 | Wt 236.0 lb

## 2017-01-09 DIAGNOSIS — Z331 Pregnant state, incidental: Secondary | ICD-10-CM

## 2017-01-09 DIAGNOSIS — Z3A39 39 weeks gestation of pregnancy: Secondary | ICD-10-CM

## 2017-01-09 DIAGNOSIS — Z3483 Encounter for supervision of other normal pregnancy, third trimester: Secondary | ICD-10-CM

## 2017-01-09 DIAGNOSIS — Z1389 Encounter for screening for other disorder: Secondary | ICD-10-CM

## 2017-01-09 LAB — POCT URINALYSIS DIPSTICK
GLUCOSE UA: NEGATIVE
KETONES UA: NEGATIVE
NITRITE UA: NEGATIVE
Protein, UA: NEGATIVE
RBC UA: NEGATIVE

## 2017-01-09 NOTE — Progress Notes (Signed)
G2P1001 [redacted]w[redacted]d Estimated Date of Delivery: 01/15/17  Blood pressure 134/88, pulse 86, weight 236 lb (107 kg), last menstrual period 04/10/2016, not currently breastfeeding.   BP weight and urine results all reviewed and noted.  Please refer to the obstetrical flow sheet for the fundal height and fetal heart rate documentation:  Patient reports good fetal movement, denies any bleeding and no rupture of membranes symptoms or regular contractions. Patient is without complaints. All questions were answered.  Orders Placed This Encounter  Procedures  . POCT urinalysis dipstick    Plan:  Continued routine obstetrical care, BP still borderline, will see more frequently, "short leash" care  Membranes stripped  Return in about 4 days (around 01/13/2017) for LROB.

## 2017-01-13 ENCOUNTER — Encounter: Payer: Self-pay | Admitting: Advanced Practice Midwife

## 2017-01-13 ENCOUNTER — Ambulatory Visit (INDEPENDENT_AMBULATORY_CARE_PROVIDER_SITE_OTHER): Payer: Medicaid Other | Admitting: Advanced Practice Midwife

## 2017-01-13 VITALS — BP 130/64 | HR 91 | Wt 236.0 lb

## 2017-01-13 DIAGNOSIS — Z1389 Encounter for screening for other disorder: Secondary | ICD-10-CM

## 2017-01-13 DIAGNOSIS — Z331 Pregnant state, incidental: Secondary | ICD-10-CM

## 2017-01-13 DIAGNOSIS — O26843 Uterine size-date discrepancy, third trimester: Secondary | ICD-10-CM

## 2017-01-13 DIAGNOSIS — Z3A39 39 weeks gestation of pregnancy: Secondary | ICD-10-CM

## 2017-01-13 DIAGNOSIS — Z3483 Encounter for supervision of other normal pregnancy, third trimester: Secondary | ICD-10-CM

## 2017-01-13 DIAGNOSIS — O480 Post-term pregnancy: Secondary | ICD-10-CM

## 2017-01-13 LAB — POCT URINALYSIS DIPSTICK
GLUCOSE UA: NEGATIVE
Ketones, UA: NEGATIVE
Nitrite, UA: NEGATIVE
PROTEIN UA: NEGATIVE

## 2017-01-13 NOTE — Progress Notes (Signed)
G2P1001 [redacted]w[redacted]d Estimated Date of Delivery: 01/15/17  Blood pressure 130/64, pulse 91, weight 236 lb (107 kg), last menstrual period 04/10/2016, not currently breastfeeding.   BP weight and urine results all reviewed and noted.  Please refer to the obstetrical flow sheet for the fundal height and fetal heart rate documentation:  Patient reports good fetal movement, denies any bleeding and no rupture of membranes symptoms or regular contractions. Patient is without complaints. All questions were answered.  Orders Placed This Encounter  Procedures  . US FETAL BPP WO NON STRESS  . POCT urinalysis dipstick    Plan:  Continued routine obstetrical care,   Return for Thursday or Friday for BPP/LFOB.

## 2017-01-15 ENCOUNTER — Telehealth (HOSPITAL_COMMUNITY): Payer: Self-pay | Admitting: *Deleted

## 2017-01-15 ENCOUNTER — Encounter (HOSPITAL_COMMUNITY): Payer: Self-pay | Admitting: *Deleted

## 2017-01-15 ENCOUNTER — Encounter: Payer: Self-pay | Admitting: Women's Health

## 2017-01-15 ENCOUNTER — Ambulatory Visit (INDEPENDENT_AMBULATORY_CARE_PROVIDER_SITE_OTHER): Payer: Medicaid Other | Admitting: Women's Health

## 2017-01-15 ENCOUNTER — Ambulatory Visit (INDEPENDENT_AMBULATORY_CARE_PROVIDER_SITE_OTHER): Payer: Medicaid Other

## 2017-01-15 VITALS — BP 124/76 | HR 92 | Wt 235.0 lb

## 2017-01-15 DIAGNOSIS — Z3483 Encounter for supervision of other normal pregnancy, third trimester: Secondary | ICD-10-CM | POA: Diagnosis not present

## 2017-01-15 DIAGNOSIS — O26843 Uterine size-date discrepancy, third trimester: Secondary | ICD-10-CM

## 2017-01-15 DIAGNOSIS — Z3A40 40 weeks gestation of pregnancy: Secondary | ICD-10-CM | POA: Diagnosis not present

## 2017-01-15 DIAGNOSIS — Z3403 Encounter for supervision of normal first pregnancy, third trimester: Secondary | ICD-10-CM

## 2017-01-15 DIAGNOSIS — Z1389 Encounter for screening for other disorder: Secondary | ICD-10-CM

## 2017-01-15 DIAGNOSIS — O480 Post-term pregnancy: Secondary | ICD-10-CM

## 2017-01-15 DIAGNOSIS — Z331 Pregnant state, incidental: Secondary | ICD-10-CM

## 2017-01-15 LAB — POCT URINALYSIS DIPSTICK
Blood, UA: NEGATIVE
Glucose, UA: NEGATIVE
KETONES UA: NEGATIVE
LEUKOCYTES UA: NEGATIVE
Nitrite, UA: NEGATIVE
Protein, UA: NEGATIVE

## 2017-01-15 NOTE — Telephone Encounter (Signed)
Preadmission screen  

## 2017-01-15 NOTE — Patient Instructions (Signed)
Call the office ([phone]) or go to [facility] if:  You begin to have strong, frequent contractions  Your water breaks.  Sometimes it is a big gush of fluid, sometimes it is just a trickle that keeps getting your panties wet or running down your legs  You have vaginal bleeding.  It is normal to have a small amount of spotting if your cervix was checked.   You don't feel your baby moving like normal.  If you don't, get you something to eat and drink and lay down and focus on feeling your baby move.  You should feel at least 10 movements in 2 hours.  If you don't, you should call the office or go to [facility].     Braxton Hicks Contractions Contractions of the uterus can occur throughout pregnancy, but they are not always a sign that you are in labor. You may have practice contractions called Braxton Hicks contractions. These false labor contractions are sometimes confused with true labor. What are Braxton Hicks contractions? Braxton Hicks contractions are tightening movements that occur in the muscles of the uterus before labor. Unlike true labor contractions, these contractions do not result in opening (dilation) and thinning of the cervix. Toward the end of pregnancy (32-34 weeks), Braxton Hicks contractions can happen more often and may become stronger. These contractions are sometimes difficult to tell apart from true labor because they can be very uncomfortable. You should not feel embarrassed if you go to the hospital with false labor. Sometimes, the only way to tell if you are in true labor is for your health care provider to look for changes in the cervix. The health care provider will do a physical exam and may monitor your contractions. If you are not in true labor, the exam should show that your cervix is not dilating and your water has not broken. If there are no prenatal problems or other health problems associated with your pregnancy, it is completely safe for you to be sent  home with false labor. You may continue to have Braxton Hicks contractions until you go into true labor. How can I tell the difference between true labor and false labor?  Differences ? False labor ? Contractions last 30-70 seconds.: Contractions are usually shorter and not as strong as true labor contractions. ? Contractions become very regular.: Contractions are usually irregular. ? Discomfort is usually felt in the top of the uterus, and it spreads to the lower abdomen and low back.: Contractions are often felt in the front of the lower abdomen and in the groin. ? Contractions do not go away with walking.: Contractions may go away when you walk around or change positions while lying down. ? Contractions usually become more intense and increase in frequency.: Contractions get weaker and are shorter-lasting as time goes on. ? The cervix dilates and gets thinner.: The cervix usually does not dilate or become thin. Follow these instructions at home:  Take over-the-counter and prescription medicines only as told by your health care provider.  Keep up with your usual exercises and follow other instructions from your health care provider.  Eat and drink lightly if you think you are going into labor.  If Braxton Hicks contractions are making you uncomfortable: ? Change your position from lying down or resting to walking, or change from walking to resting. ? Sit and rest in a tub of warm water. ? Drink enough fluid to keep your urine clear or pale yellow. Dehydration may cause these contractions. ?   Do slow and deep breathing several times an hour.  Keep all follow-up prenatal visits as told by your health care provider. This is important. Contact a health care provider if:  You have a fever.  You have continuous pain in your abdomen. Get help right away if:  Your contractions become stronger, more regular, and closer together.  You have fluid leaking or gushing from your vagina.  You  pass blood-tinged mucus (bloody show).  You have bleeding from your vagina.  You have low back pain that you never had before.  You feel your baby's head pushing down and causing pelvic pressure.  Your baby is not moving inside you as much as it used to. Summary  Contractions that occur before labor are called Braxton Hicks contractions, false labor, or practice contractions.  Braxton Hicks contractions are usually shorter, weaker, farther apart, and less regular than true labor contractions. True labor contractions usually become progressively stronger and regular and they become more frequent.  Manage discomfort from Braxton Hicks contractions by changing position, resting in a warm bath, drinking plenty of water, or practicing deep breathing. This information is not intended to replace advice given to you by your health care provider. Make sure you discuss any questions you have with your health care provider. Document Released: 03/24/2005 Document Revised: 02/11/2016 Document Reviewed: 02/11/2016 Elsevier Interactive Patient Education  2017 [employer]  

## 2017-01-15 NOTE — Progress Notes (Signed)
[place] 40 WKS,cephalic,fhr 127 bpm,BPP 8/8,bilat adnexa's wnl,post pl gr 3,afi 15.8 cm,efw 3725 g 71%

## 2017-01-15 NOTE — Treatment Plan (Signed)
Induction Assessment Scheduling Form Fax to [facility]:  [phone]  Andrea Stephenson                                                                                   DOB:  12-11-1990                                                            MRN:  [id number]                                                                     Phone #:   [phone]                         Provider:  [facility]  GP:  [id number]                                                            Estimated Date of Delivery: 01/15/17  Dating Criteria: LMP c/w 8wk u/s    Medical Indications for induction:  postdates Admission Date/Time:  10/18 @ 0730 Gestational age on admission:  41.0   Filed Weights   01/15/17 0910  Weight: 235 lb (106.6 kg)   HIV:   neg GBS: Negative (09/20 1400)  4.5/80/-2, vtx   Method of induction(proposed):  Pitocin/arom   Scheduling Provider Signature:  Marge Duncans, CNM                                            Today's Date:  01/15/2017

## 2017-01-15 NOTE — Progress Notes (Signed)
   Low-Risk Pregnancy Visit Patient name: Andrea Stephenson MRN [id number]  Date of birth: Sep 29, 1990 CC & HPI:  Andrea Stephenson is a 26 y.o. G2P1001 female at [redacted]w[redacted]d with an Estimated Date of Delivery: 01/15/17 being seen today for ongoing management of a low-risk pregnancy.  Today she reports no complaints. Contractions: Not present. Vag. Bleeding: None.  Movement: Present. denies leaking of fluid.  Review of Systems:   Denies abnormal vaginal discharge w/ itching/odor/irritation, headaches, visual changes, shortness of breath, chest pain, abdominal pain, severe nausea/vomiting, or problems with urination or bowel movements unless otherwise stated above. Pertinent History Reviewed:  Reviewed past medical,surgical, social and family history.  Reviewed problem list, medications and allergies. Objective Findings:    Vitals:   01/15/17 0910  BP: 124/76  Pulse: 92  Weight: 235 lb (106.6 kg)  Body mass index is 39.11 kg/m.    General appearance: Well appearing, and in no distress  Mental status: Alert, oriented to person, place, and time  Skin: Warm & dry  Cardiovascular: Normal heart rate noted  Respiratory: Normal respiratory effort, no distress  Abdomen: Soft, gravid  Pelvic: Cervical exam performed  Dilation: 4.5 Effacement (%): 80 Station: -2  Offered membrane sweeping, discussed r/b- pt decided to proceed, so membranes swept.   Extremities: Edema: Trace  Fetal Status: Fetal Heart Rate (bpm): 127 u/s Fundal Height: 40 cm Movement: Present Presentation: Vertex  Results for orders placed or performed in visit on 01/15/17 (from the past 24 hour(s))  POCT Urinalysis Dipstick   Collection Time: 01/15/17  9:11 AM  Result Value Ref Range   Color, UA     Clarity, UA     Glucose, UA neg    Bilirubin, UA     Ketones, UA neg    Spec Grav, UA  1.010 - 1.025   Blood, UA neg    pH, UA  5.0 - 8.0   Protein, UA neg    Urobilinogen, UA  0.2 or 1.0 E.U./dL   Nitrite, UA neg    Leukocytes, UA  Negative Negative    Assessment & Plan:   1) Low-risk pregnancy G2P1001 at [redacted]w[redacted]d with an Estimated Date of Delivery: 01/15/17   2) Postdates, normal bpp today, IOL scheduled for 10/18 @ 0730 if needed, IOL form sent via EPIC, orders placed   Labs/procedures today: none  Plan:  Continue routine obstetrical care   Reviewed: Term labor symptoms and general obstetric precautions including but not limited to vaginal bleeding, contractions, leaking of fluid and fetal movement were reviewed in detail with the patient.  All questions were answered  Return for 4-6wks , postpartum visit.  Orders Placed This Encounter  Procedures  . POCT Urinalysis Dipstick   Marge Duncans CNM, [facility] 01/15/2017 10:16 AM

## 2017-01-16 ENCOUNTER — Inpatient Hospital Stay (HOSPITAL_COMMUNITY): Payer: Medicaid Other | Admitting: Anesthesiology

## 2017-01-16 ENCOUNTER — Inpatient Hospital Stay (HOSPITAL_COMMUNITY)
Admission: AD | Admit: 2017-01-16 | Discharge: 2017-01-18 | DRG: 807 | Disposition: A | Discharge status: Home / Self Care | Payer: Medicaid Other | Source: Ambulatory Visit | Attending: Obstetrics and Gynecology | Admitting: Obstetrics and Gynecology

## 2017-01-16 ENCOUNTER — Encounter (HOSPITAL_COMMUNITY): Payer: Self-pay | Admitting: *Deleted

## 2017-01-16 DIAGNOSIS — O99214 Obesity complicating childbirth: Secondary | ICD-10-CM | POA: Diagnosis present

## 2017-01-16 DIAGNOSIS — Z6839 Body mass index (BMI) 39.0-39.9, adult: Secondary | ICD-10-CM | POA: Diagnosis not present

## 2017-01-16 DIAGNOSIS — O26893 Other specified pregnancy related conditions, third trimester: Secondary | ICD-10-CM | POA: Diagnosis present

## 2017-01-16 DIAGNOSIS — Z3403 Encounter for supervision of normal first pregnancy, third trimester: Secondary | ICD-10-CM

## 2017-01-16 DIAGNOSIS — Z6791 Unspecified blood type, Rh negative: Secondary | ICD-10-CM | POA: Diagnosis not present

## 2017-01-16 DIAGNOSIS — D649 Anemia, unspecified: Secondary | ICD-10-CM | POA: Diagnosis present

## 2017-01-16 DIAGNOSIS — Z87891 Personal history of nicotine dependence: Secondary | ICD-10-CM | POA: Diagnosis not present

## 2017-01-16 DIAGNOSIS — Z91040 Latex allergy status: Secondary | ICD-10-CM

## 2017-01-16 DIAGNOSIS — Z3490 Encounter for supervision of normal pregnancy, unspecified, unspecified trimester: Secondary | ICD-10-CM

## 2017-01-16 DIAGNOSIS — O9902 Anemia complicating childbirth: Principal | ICD-10-CM | POA: Diagnosis present

## 2017-01-16 DIAGNOSIS — Z3A40 40 weeks gestation of pregnancy: Secondary | ICD-10-CM

## 2017-01-16 LAB — CBC
HCT: 32.8 % — ABNORMAL LOW (ref 36.0–46.0)
Hemoglobin: 10.9 g/dL — ABNORMAL LOW (ref 12.0–15.0)
MCH: 26 pg (ref 26.0–34.0)
MCHC: 33.2 g/dL (ref 30.0–36.0)
MCV: 78.1 fL (ref 78.0–100.0)
Platelets: 166 10*3/uL (ref 150–400)
RBC: 4.2 MIL/uL (ref 3.87–5.11)
RDW: 14.5 % (ref 11.5–15.5)
WBC: 12.7 10*3/uL — ABNORMAL HIGH (ref 4.0–10.5)

## 2017-01-16 LAB — TYPE AND SCREEN
ABO/RH(D): A NEG
Antibody Screen: NEGATIVE

## 2017-01-16 MED ORDER — ONDANSETRON HCL 4 MG PO TABS: 4.0000 mg | ORAL_TABLET | ORAL | Status: DC | PRN

## 2017-01-16 MED ORDER — BENZOCAINE-MENTHOL 20-0.5 % EX AERO
1.0000 | INHALATION_SPRAY | CUTANEOUS | Status: DC | PRN
Start: 2017-01-16 — End: 2017-01-18
  Administered 2017-01-16: 1 via TOPICAL
  Filled 2017-01-16: qty 56

## 2017-01-16 MED ORDER — SIMETHICONE 80 MG PO CHEW: 80.0000 mg | CHEWABLE_TABLET | ORAL | Status: DC | PRN

## 2017-01-16 MED ORDER — PHENYLEPHRINE 40 MCG/ML (10ML) SYRINGE FOR IV PUSH (FOR BLOOD PRESSURE SUPPORT)
80.0000 ug | PREFILLED_SYRINGE | INTRAVENOUS | Status: DC | PRN
  Filled 2017-01-16: qty 5

## 2017-01-16 MED ORDER — OXYTOCIN 40 UNITS IN LACTATED RINGERS INFUSION - SIMPLE MED
2.5000 [IU]/h | INTRAVENOUS | Status: DC
  Filled 2017-01-16: qty 1000

## 2017-01-16 MED ORDER — TETANUS-DIPHTH-ACELL PERTUSSIS 5-2.5-18.5 LF-MCG/0.5 IM SUSP
0.5000 mL | Freq: Once | INTRAMUSCULAR | Status: AC
  Administered 2017-01-17: 0.5 mL via INTRAMUSCULAR
  Filled 2017-01-16: qty 0.5

## 2017-01-16 MED ORDER — ONDANSETRON HCL 4 MG/2ML IJ SOLN: 4.0000 mg | INTRAMUSCULAR | Status: DC | PRN

## 2017-01-16 MED ORDER — FENTANYL 2.5 MCG/ML BUPIVACAINE 1/10 % EPIDURAL INFUSION (WH - ANES)
14.0000 mL/h | INTRAMUSCULAR | Status: DC | PRN
  Administered 2017-01-16: 14 mL/h via EPIDURAL
  Filled 2017-01-16: qty 100

## 2017-01-16 MED ORDER — EPHEDRINE 5 MG/ML INJ
10.0000 mg | INTRAVENOUS | Status: DC | PRN
  Filled 2017-01-16: qty 2

## 2017-01-16 MED ORDER — DIPHENHYDRAMINE HCL 50 MG/ML IJ SOLN: 12.5000 mg | INTRAMUSCULAR | Status: DC | PRN

## 2017-01-16 MED ORDER — DOCUSATE SODIUM 100 MG PO CAPS
100.0000 mg | ORAL_CAPSULE | Freq: Two times a day (BID) | ORAL | Status: DC
  Administered 2017-01-16 – 2017-01-18 (×4): 100 mg via ORAL
  Filled 2017-01-16 (×4): qty 1

## 2017-01-16 MED ORDER — OXYCODONE-ACETAMINOPHEN 5-325 MG PO TABS: 1.0000 | ORAL_TABLET | ORAL | Status: DC | PRN

## 2017-01-16 MED ORDER — ONDANSETRON HCL 4 MG/2ML IJ SOLN: 4.0000 mg | Freq: Four times a day (QID) | INTRAMUSCULAR | Status: DC | PRN

## 2017-01-16 MED ORDER — FLEET ENEMA 7-19 GM/118ML RE ENEM: 1.0000 | ENEMA | RECTAL | Status: DC | PRN

## 2017-01-16 MED ORDER — METHYLERGONOVINE MALEATE 0.2 MG/ML IJ SOLN: 0.2000 mg | INTRAMUSCULAR | Status: DC | PRN

## 2017-01-16 MED ORDER — IBUPROFEN 600 MG PO TABS
600.0000 mg | ORAL_TABLET | Freq: Four times a day (QID) | ORAL | Status: DC
  Administered 2017-01-16 – 2017-01-18 (×9): 600 mg via ORAL
  Filled 2017-01-16 (×9): qty 1

## 2017-01-16 MED ORDER — DIPHENHYDRAMINE HCL 25 MG PO CAPS: 25.0000 mg | ORAL_CAPSULE | Freq: Four times a day (QID) | ORAL | Status: DC | PRN

## 2017-01-16 MED ORDER — ACETAMINOPHEN 325 MG PO TABS: 650.0000 mg | ORAL_TABLET | ORAL | Status: DC | PRN

## 2017-01-16 MED ORDER — METHYLERGONOVINE MALEATE 0.2 MG PO TABS: 0.2000 mg | ORAL_TABLET | ORAL | Status: DC | PRN

## 2017-01-16 MED ORDER — LACTATED RINGERS IV SOLN: 500.0000 mL | INTRAVENOUS | Status: DC | PRN

## 2017-01-16 MED ORDER — OXYCODONE HCL 5 MG PO TABS
5.0000 mg | ORAL_TABLET | ORAL | Status: DC | PRN
  Administered 2017-01-17 – 2017-01-18 (×3): 5 mg via ORAL
  Filled 2017-01-16 (×3): qty 1

## 2017-01-16 MED ORDER — LACTATED RINGERS IV SOLN
INTRAVENOUS | Status: DC
  Administered 2017-01-16: 06:00:00 via INTRAVENOUS

## 2017-01-16 MED ORDER — LIDOCAINE HCL (PF) 1 % IJ SOLN
INTRAMUSCULAR | Status: DC | PRN
  Administered 2017-01-16: 7 mL via EPIDURAL
  Administered 2017-01-16: 6 mL via EPIDURAL

## 2017-01-16 MED ORDER — FENTANYL CITRATE (PF) 100 MCG/2ML IJ SOLN: 100.0000 ug | INTRAMUSCULAR | Status: DC | PRN

## 2017-01-16 MED ORDER — COCONUT OIL OIL: 1.0000 "application " | TOPICAL_OIL | Status: DC | PRN

## 2017-01-16 MED ORDER — OXYTOCIN BOLUS FROM INFUSION
500.0000 mL | Freq: Once | INTRAVENOUS | Status: AC
  Administered 2017-01-16: 500 mL/h via INTRAVENOUS

## 2017-01-16 MED ORDER — LIDOCAINE HCL (PF) 1 % IJ SOLN
30.0000 mL | INTRAMUSCULAR | Status: DC | PRN
  Filled 2017-01-16: qty 30

## 2017-01-16 MED ORDER — OXYCODONE HCL 5 MG PO TABS: 10.0000 mg | ORAL_TABLET | ORAL | Status: DC | PRN

## 2017-01-16 MED ORDER — FERROUS SULFATE 325 (65 FE) MG PO TABS
325.0000 mg | ORAL_TABLET | Freq: Two times a day (BID) | ORAL | Status: DC
  Administered 2017-01-16 – 2017-01-18 (×4): 325 mg via ORAL
  Filled 2017-01-16 (×4): qty 1

## 2017-01-16 MED ORDER — OXYCODONE-ACETAMINOPHEN 5-325 MG PO TABS: 2.0000 | ORAL_TABLET | ORAL | Status: DC | PRN

## 2017-01-16 MED ORDER — LACTATED RINGERS IV SOLN
500.0000 mL | Freq: Once | INTRAVENOUS | Status: AC
  Administered 2017-01-16: 500 mL via INTRAVENOUS

## 2017-01-16 MED ORDER — ACETAMINOPHEN 325 MG PO TABS
650.0000 mg | ORAL_TABLET | ORAL | Status: DC | PRN
  Administered 2017-01-17 – 2017-01-18 (×3): 650 mg via ORAL
  Filled 2017-01-16 (×3): qty 2

## 2017-01-16 MED ORDER — SOD CITRATE-CITRIC ACID 500-334 MG/5ML PO SOLN: 30.0000 mL | ORAL | Status: DC | PRN

## 2017-01-16 MED ORDER — ZOLPIDEM TARTRATE 5 MG PO TABS: 5.0000 mg | ORAL_TABLET | Freq: Every evening | ORAL | Status: DC | PRN

## 2017-01-16 MED ORDER — MEASLES, MUMPS & RUBELLA VAC ~~LOC~~ INJ
0.5000 mL | INJECTION | Freq: Once | SUBCUTANEOUS | Status: DC
  Filled 2017-01-16: qty 0.5

## 2017-01-16 MED ORDER — PHENYLEPHRINE 40 MCG/ML (10ML) SYRINGE FOR IV PUSH (FOR BLOOD PRESSURE SUPPORT)
80.0000 ug | PREFILLED_SYRINGE | INTRAVENOUS | Status: DC | PRN
  Filled 2017-01-16: qty 10
  Filled 2017-01-16: qty 5

## 2017-01-16 MED ORDER — FLEET ENEMA 7-19 GM/118ML RE ENEM: 1.0000 | ENEMA | Freq: Every day | RECTAL | Status: DC | PRN

## 2017-01-16 MED ORDER — BISACODYL 10 MG RE SUPP: 10.0000 mg | Freq: Every day | RECTAL | Status: DC | PRN

## 2017-01-16 MED ORDER — PRENATAL MULTIVITAMIN CH
1.0000 | ORAL_TABLET | Freq: Every day | ORAL | Status: DC
  Administered 2017-01-16 – 2017-01-18 (×3): 1 via ORAL
  Filled 2017-01-16 (×3): qty 1

## 2017-01-16 MED ORDER — DIBUCAINE 1 % RE OINT: 1.0000 "application " | TOPICAL_OINTMENT | RECTAL | Status: DC | PRN

## 2017-01-16 MED ORDER — WITCH HAZEL-GLYCERIN EX PADS: 1.0000 "application " | MEDICATED_PAD | CUTANEOUS | Status: DC | PRN

## 2017-01-16 NOTE — H&P (Signed)
Andrea Stephenson is a 26 y.o. female G2P1001 with IUP at [redacted]w[redacted]d presenting for contractions. Pt states she has been having regular, every 3-5 minutes contractions, associated with none vaginal bleeding for 6 hours..  Membranes are intact, with active fetal movement.   [facility] 8 wks  Prenatal History/Complications: none Past Medical History: Past Medical History:  Diagnosis Date  . Anxiety   . History of ADHD 10/06/2012    Past Surgical History: Past Surgical History:  Procedure Laterality Date  . NO PAST SURGERIES      Obstetrical History: OB History    Gravida Para Term Preterm AB Living   SAB TAB Ectopic Multiple Live Births         0 1       Social History: Social History   Social History  . Marital status: Single    Spouse name: N/A  . Number of children: N/A  . Years of education: N/A   Social History Main Topics  . Smoking status: Former Smoker    Packs/day: 0.50    Types: Cigarettes    Quit date: 05/06/2016  . Smokeless tobacco: Never Used  . Alcohol use No  . Drug use: No  . Sexual activity: Yes    Birth control/ protection: None   Other Topics Concern  . None   Social History Narrative  . None    Family History: Family History  Problem Relation Age of Onset  . Diabetes Mother   . Hypertension Mother   . Hypertension Father   . Cerebral palsy Sister   . Cancer Maternal Grandmother        lung ca  . Diabetes Maternal Grandmother   . Hypertension Maternal Grandmother   . Heart attack Maternal Grandfather   . Heart attack Paternal Grandmother   . Heart disease Paternal Grandmother   . Heart attack Paternal Grandfather   . Heart disease Paternal Grandfather     Allergies: Allergies  Allergen Reactions  . Other Shortness Of Breath    Pecans  . Latex Itching    Latex     Prescriptions Prior to Admission  Medication Sig Dispense Refill Last Dose  . calcium carbonate (TUMS - DOSED IN MG ELEMENTAL CALCIUM)  500 MG chewable tablet Chew 1 tablet by mouth daily.   01/16/2017 at Unknown time  . ferrous sulfate 325 (65 FE) MG tablet Take 1 tablet (325 mg total) by mouth 2 (two) times daily with a meal. 60 tablet 3 01/15/2017 at Unknown time  . Prenatal Multivit-Min-Fe-FA (PRENATAL VITAMINS PO) Take by mouth daily.   01/15/2017 at Unknown time  . acetaminophen (TYLENOL) 325 MG tablet Take 650 mg by mouth as needed.   Taking        Review of Systems   Constitutional: Negative for fever and chills Eyes: Negative for visual disturbances Respiratory: Negative for shortness of breath, dyspnea Cardiovascular: Negative for chest pain or palpitations  Gastrointestinal: Negative for vomiting, diarrhea and constipation.  POSITIVE for abdominal pain (contractions) Genitourinary: Negative for dysuria and urgency Musculoskeletal: Negative for back pain, joint pain, myalgias  Neurological: Negative for dizziness and headaches      Blood pressure (!) 130/97, pulse 94, temperature 98.4 F (36.9 C), temperature source Oral, resp. rate 16, height  (1.651 m), weight 106.6 kg (235 lb), last menstrual period 04/10/2016, not currently breastfeeding. General appearance: alert, cooperative and no distress Lungs: clear to auscultation bilaterally Heart:  regular rate and rhythm Abdomen: soft, non-tender; bowel sounds normal Extremities: Homans sign is negative, no sign of DVT DTR's 2+ Presentation: cephalic Fetal monitoring  Baseline: 145 bpm, Variability: Good {> 6 bpm), Accelerations: Reactive and Decelerations: Absent Uterine activity  2-3 Dilation: 6.5 Effacement (%): 90 Station: -1   Prenatal labs: ABO, Rh:   Antibody: Negative (07/25 0908) Rubella:   RPR: Non Reactive (07/25 0908)  HBsAg: Negative (03/21 1136)  HIV:   neg GBS: Negative (09/20 1400)    Prenatal Transfer Tool  Maternal Diabetes: No Genetic Screening: Normal Maternal Ultrasounds/Referrals: Normal Fetal Ultrasounds or  other Referrals:  None Maternal Substance Abuse:  No Significant Maternal Medications:  None Significant Maternal Lab Results: Lab values include: Group B Strep negative     Results for orders placed or performed during the hospital encounter of 01/16/17 (from the past 24 hour(s))  CBC   Collection Time: 01/16/17  5:56 AM  Result Value Ref Range   WBC 12.7 (H) 4.0 - 10.5 K/uL   RBC 4.20 3.87 - 5.11 MIL/uL   Hemoglobin 10.9 (L) 12.0 - 15.0 g/dL   HCT 16.1 (L) 09.6 - 04.5 %   MCV 78.1 78.0 - 100.0 fL   MCH 26.0 26.0 - 34.0 pg   MCHC 33.2 30.0 - 36.0 g/dL   RDW 40.9 81.1 - 91.4 %   Platelets 166 150 - 400 K/uL  Results for orders placed or performed in visit on 01/15/17 (from the past 24 hour(s))  POCT Urinalysis Dipstick   Collection Time: 01/15/17  9:11 AM  Result Value Ref Range   Color, UA     Clarity, UA     Glucose, UA neg    Bilirubin, UA     Ketones, UA neg    Spec Grav, UA  1.010 - 1.025   Blood, UA neg    pH, UA  5.0 - 8.0   Protein, UA neg    Urobilinogen, UA  0.2 or 1.0 E.U./dL   Nitrite, UA neg    Leukocytes, UA Negative Negative  v  Assessment: Andrea Stephenson is a 26 y.o. G2P1001 with an IUP at [redacted]w[redacted]d presenting for labor  Plan: #Labor: expectant management #Pain:  Per request #FWB Cat 1    CRESENZO-DISHMAN,Jaykub Mackins 01/16/2017, 6:48 AM

## 2017-01-16 NOTE — Anesthesia Postprocedure Evaluation (Signed)
Anesthesia Post Note  Patient: Andrea Stephenson  Procedure(s) Performed: AN AD HOC LABOR EPIDURAL     Patient location during evaluation: Mother Baby Anesthesia Type: Epidural Level of consciousness: awake and alert and oriented Pain management: satisfactory to patient Vital Signs Assessment: post-procedure vital signs reviewed and stable Respiratory status: spontaneous breathing and nonlabored ventilation Cardiovascular status: stable Postop Assessment: no headache, no backache, no signs of nausea or vomiting, adequate PO intake and patient able to bend at knees (patient up walking) Anesthetic complications: no    Last Vitals:  Vitals:   01/16/17 1215 01/16/17 1610  BP: 121/64 124/65  Pulse: 76 69  Resp: 20 18  Temp: 37 C 36.9 C  SpO2:      Last Pain:  Vitals:   01/16/17 1610  TempSrc: Oral  PainSc: 0-No pain   Pain Goal: Patients Stated Pain Goal: 5 (01/16/17 0710)               Madison Hickman

## 2017-01-16 NOTE — Anesthesia Procedure Notes (Signed)
Epidural Patient location during procedure: OB Start time: 01/16/2017 6:46 AM End time: 01/16/2017 6:49 AM  Staffing Anesthesiologist: Leilani Able Performed: anesthesiologist   Preanesthetic Checklist Completed: patient identified, surgical consent, pre-op evaluation, timeout performed, IV checked, risks and benefits discussed and monitors and equipment checked  Epidural Patient position: sitting Prep: site prepped and draped and DuraPrep Patient monitoring: continuous pulse ox and blood pressure Approach: midline Location: L3-L4 Injection technique: LOR air  Needle:  Needle type: Tuohy  Needle gauge: 17 G Needle length: 9 cm and 9 Needle insertion depth: 5 cm cm Catheter type: closed end flexible Catheter size: 19 Gauge Catheter at skin depth: 10 cm Test dose: negative and Other  Assessment Sensory level: T9 Events: blood not aspirated, injection not painful, no injection resistance, negative IV test and no paresthesia  Additional Notes Reason for block:procedure for pain

## 2017-01-16 NOTE — MAU Note (Signed)
PT  SAYS UC HURT STRONG  SINCE [place]  .  PNC  WITH FAMILY  TREE-   VE IN OFFICE    4-5 CM YESTERDAY- AND  STRIPPED MEMBRANES.    DENIES HSV AND  MRSA.  GBS-  NEG

## 2017-01-16 NOTE — Anesthesia Preprocedure Evaluation (Signed)
Anesthesia Evaluation  Patient identified by MRN, date of birth, ID band Patient awake    Reviewed: Allergy & Precautions, H&P , NPO status , Patient's Chart, lab work & pertinent test results  Airway Mallampati: II  TM Distance: >3 FB Neck ROM: full    Dental no notable dental hx. (+) Teeth Intact   Pulmonary Current Smoker, former smoker,    Pulmonary exam normal breath sounds clear to auscultation       Cardiovascular Normal cardiovascular exam Rhythm:regular Rate:Normal     Neuro/Psych negative neurological ROS     GI/Hepatic negative GI ROS, Neg liver ROS,   Endo/Other  Morbid obesity  Renal/GU negative Renal ROS     Musculoskeletal negative musculoskeletal ROS (+)   Abdominal (+) + obese,   Peds  Hematology negative hematology ROS (+) Blood dyscrasia, anemia ,   Anesthesia Other Findings       Reproductive/Obstetrics (+) Pregnancy                             Anesthesia Physical  Anesthesia Plan  ASA: III  Anesthesia Plan: Epidural   Post-op Pain Management:    Induction:   PONV Risk Score and Plan:   Airway Management Planned:   Additional Equipment:   Intra-op Plan:   Post-operative Plan:   Informed Consent: I have reviewed the patients History and Physical, chart, labs and discussed the procedure including the risks, benefits and alternatives for the proposed anesthesia with the patient or authorized representative who has indicated his/her understanding and acceptance.     Plan Discussed with:   Anesthesia Plan Comments:         Anesthesia Quick Evaluation

## 2017-01-16 NOTE — Anesthesia Pain Management Evaluation Note (Signed)
  CRNA Pain Management Visit Note  Patient: Andrea Stephenson, 26 y.o., female  "Hello I am a member of the anesthesia team at [facility]. We have an anesthesia team available at all times to provide care throughout the hospital, including epidural management and anesthesia for C-section. I don't know your plan for the delivery whether it a natural birth, water birth, IV sedation, nitrous supplementation, doula or epidural, but we want to meet your pain goals."   1.Was your pain managed to your expectations on prior hospitalizations?   Yes   2.What is your expectation for pain management during this hospitalization?     Epidural  3.How can we help you reach that goal? Epidural in place at time of visit  Record the patient's initial score and the patient's pain goal.   Pain: 2  Pain Goal: 5 [facility] wants you to be able to say your pain was always managed very well.  Rica Records 01/16/2017

## 2017-01-17 LAB — BIRTH TISSUE RECOVERY COLLECTION (PLACENTA DONATION)

## 2017-01-17 LAB — RPR: RPR: NONREACTIVE

## 2017-01-17 MED ORDER — RHO D IMMUNE GLOBULIN 1500 UNIT/2ML IJ SOSY
300.0000 ug | PREFILLED_SYRINGE | Freq: Once | INTRAMUSCULAR | Status: AC
  Administered 2017-01-17: 300 ug via INTRAVENOUS
  Filled 2017-01-17: qty 2

## 2017-01-17 NOTE — Progress Notes (Signed)
[facility] power was out and their back up generator also went out. Lab results should be reported tonight on 3rd shift or by the latest noon tomorrow.

## 2017-01-17 NOTE — Progress Notes (Signed)
Notified lab that rpr test has not resulted, they will call back when they get the result.

## 2017-01-17 NOTE — Progress Notes (Signed)
CSW received consult for hx of Anxiety and Depression.  CSW met with MOB to offer support and complete assessment.    When CSW arrived, MOB was resting on the couch and infant was asleep on phototherapy in bassinet. MOB was polite and receptive to meeting with CSW.    CSW inquired about MOB's MH hx an MOB acknowledged a hx of anxiety, depression, and PPD.  MOB reported MOB is not currently taking any medication but was prescribed Lexapro after the birth of MOB's oldest child.  MOB communicated that MOB discontinued the medication after MOB's pregnancy confirmation. MOB denied having any symptoms during pregnancy.   MOB reported chronic fatigue, social isolation, and daily sadness for about 8 months after the birth of MOB's oldest child.   CSW provided education regarding the baby blues period vs. perinatal mood disorders, discussed treatment and gave resources for mental health follow up if concerns arise.  CSW recommends self-evaluation during the postpartum time period using the New Mom Checklist from Postpartum Progress and encouraged MOB to contact a medical professional if symptoms are noted at any time. MOB did not present with any acute signs or symtoms and appeared to have insight and awareness about her MH.    CSW identifies no further need for intervention and no barriers to discharge at this time.  Andrea Stephenson, MSW, LCSW Clinical Social Work [phone] 

## 2017-01-17 NOTE — Discharge Summary (Signed)
OB Discharge Summary     Patient Name: Andrea Stephenson DOB: [birth year] MRN: [id number] Date of admission: 01/16/2017  Delivering MD: Jacklyn Shell )  Date of discharge: 01/17/2017    Admitting diagnosis: pregnancy at [redacted] weeks gestation Intrauterine pregnancy: [redacted]w[redacted]d    Secondary diagnosis:  Active Problems:   Patient Active Problem List   Diagnosis Date Noted  . Pregnancy 01/16/2017  . Rh negative state in antepartum period 07/23/2016  . Supervision of normal pregnancy 06/25/2016  . Anxiety 06/25/2016  . Postpartum depression 06/20/2015  . History of ADHD 10/06/2012    Additional problems: none     Discharge diagnosis: Term Pregnancy Delivered                                                                                                Post partum procedures:none  Complications: None  Hospital course:  Onset of Labor With Vaginal Delivery     26 y.o. yo G2P1001 at [redacted]w[redacted]d was admitted in Active Labor on 01/16/2017. Patient had an uncomplicated labor course as follows:  Membrane Rupture Time/Date: 8:30 AM ,01/16/2017   Intrapartum Procedures: Episiotomy: None [1]                                         Lacerations:  None [1]  Patient had a delivery of a Viable infant. 01/16/2017  Information for the patient's newborn:  Zuma, Hust [[id number]]  Delivery Method: Vag-Spont    Pateint had an uncomplicated postpartum course.  She is ambulating, tolerating a regular diet, passing flatus, and urinating well. Patient is discharged home in stable condition on 01/17/17.   Physical exam  Vitals:   01/17/17 1800 01/17/17 1813  BP: 128/72 131/88  Pulse: 72 67  Resp: 18 18  Temp: 98 F (36.7 C) 98.3 F (36.8 C)  SpO2:      General: alert, cooperative and no distress Lochia: appropriate Uterine Fundus: firm Incision: N/A DVT Evaluation: No evidence of DVT seen on physical exam.  Labs: Results for orders placed or performed during the hospital encounter of  01/16/17 (from the past 24 hour(s))  Rh IG workup (includes ABO/Rh)     Status: None (Preliminary result)   Collection Time: 01/17/17  5:04 AM  Result Value Ref Range   Gestational Age(Wks) 40.1    ABO/RH(D) A NEG    Fetal Screen NEG    Unit Number [id number]    Blood Component Type RHIG    Unit division 00    Status of Unit ISSUED    Transfusion Status OK TO TRANSFUSE   Collect bld for placenta donatation     Status: None   Collection Time: 01/17/17  5:04 AM  Result Value Ref Range   Placenta donation bld collect COLLECTED BY LABORATORY      Discharge instruction: per After Visit Summary and "Baby and Me Booklet".  After visit meds:  Allergies  Allergen Reactions  . 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   . Latex Itching and Rash    Allergies as of 01/18/2017      Reactions   Food Shortness Of Breath, Other (See Comments)   Pt is allergic to pecans.    Latex Itching, Rash      Medication List    STOP taking these medications   calcium carbonate 500 MG chewable tablet Commonly known as:  TUMS - dosed in mg elemental calcium     TAKE these medications   acetaminophen 500 MG tablet Commonly known as:  TYLENOL Take 1,000 mg by mouth every 6 (six) hours as needed for mild pain, moderate pain or headache.   ferrous sulfate 325 (65 FE) MG tablet Take 1 tablet (325 mg total) by mouth 2 (two) times daily with a meal.   ibuprofen 600 MG tablet Commonly known as:  ADVIL,MOTRIN Take 1 tablet (600 mg total) by mouth every 6 (six) hours.   multivitamin-prenatal 27-0.8 MG Tabs tablet Take 1 tablet by mouth at bedtime.   norethindrone 0.35 MG tablet Commonly known as:  MICRONOR,CAMILA,ERRIN Take 1 tablet (0.35 mg total) by mouth daily.        Diet: routine diet  Activity: Advance as tolerated. Pelvic rest for 6 weeks.   Outpatient follow up:4 weeks Future Appointments: Future Appointments Date Time Provider  Department Center  02/19/2017 3:30 PM Cresenzo-Dishmon, Scarlette Calico, CNM [facility]     Postpartum contraception: POP  Newborn Data: APGAR (1 MIN): 8   APGAR (5 MINS): 9     Baby Feeding: Bottle and Breast Disposition:home with mother  Rolm Bookbinder, DO  01/17/2017

## 2017-01-17 NOTE — Progress Notes (Signed)
POSTPARTUM PROGRESS NOTE  Post Partum Day 1 Subjective:  Andrea Stephenson is a 26 y.o. G2P1001 [redacted]w[redacted]d s/p SVD.  No acute events overnight.  Pt denies problems with ambulating, voiding or po intake.  She denies nausea or vomiting.  Pain is well controlled.  She has had flatus. She has not had bowel movement.  Lochia Minimal.   Objective: Blood pressure 116/70, pulse 60, temperature 97.9 F (36.6 C), temperature source Oral, resp. rate 15, height  (1.651 m), weight 106.6 kg (235 lb), last menstrual period 04/10/2016, SpO2 100 %, not currently breastfeeding.  Physical Exam:  General: alert, cooperative and no distress Lochia:normal flow Chest: CTAB Heart: RRR no m/r/g Abdomen: +BS, soft, nontender,  Uterine Fundus: firm DVT Evaluation: No calf swelling or tenderness Extremities: no edema   Recent Labs  01/16/17 0556  HGB 10.9*  HCT 32.8*    Assessment/Plan:  ASSESSMENT: Andrea Stephenson is a 89 y.o. G2P1001 [redacted]w[redacted]d s/p SVD. Doing well with no concerns, but wants to stay today.   Plan for discharge tomorrow   LOS: 1 day   Renne Musca, MD PGY-2 [facility]  01/17/2017, 9:48 AM

## 2017-01-18 LAB — RH IG WORKUP (INCLUDES ABO/RH)
ABO/RH(D): A NEG
FETAL SCREEN: NEGATIVE
Gestational Age(Wks): 40.1
UNIT DIVISION: 0

## 2017-01-18 MED ORDER — IBUPROFEN 600 MG PO TABS: 600.0000 mg | ORAL_TABLET | Freq: Four times a day (QID) | ORAL | 0 refills | Status: DC

## 2017-01-18 MED ORDER — NORETHINDRONE 0.35 MG PO TABS: 1.0000 | ORAL_TABLET | Freq: Every day | ORAL | 11 refills | Status: DC

## 2017-01-18 NOTE — Discharge Instructions (Signed)

## 2017-01-19 LAB — RUBELLA SCREEN: Rubella: 2.46 index (ref >0.99)

## 2017-01-22 ENCOUNTER — Inpatient Hospital Stay (HOSPITAL_COMMUNITY): Admission: RE | Admit: 2017-01-22 | Payer: Medicaid Other | Source: Ambulatory Visit

## 2017-02-19 ENCOUNTER — Ambulatory Visit (INDEPENDENT_AMBULATORY_CARE_PROVIDER_SITE_OTHER): Payer: Medicaid Other | Admitting: Advanced Practice Midwife

## 2017-02-19 ENCOUNTER — Encounter: Payer: Self-pay | Admitting: *Deleted

## 2017-02-19 DIAGNOSIS — Z30011 Encounter for initial prescription of contraceptive pills: Secondary | ICD-10-CM | POA: Diagnosis not present

## 2017-02-19 MED ORDER — ESCITALOPRAM OXALATE 10 MG PO TABS: 10.0000 mg | ORAL_TABLET | Freq: Every day | ORAL | 6 refills | Status: DC

## 2017-02-19 MED ORDER — NORETHIN ACE-ETH ESTRAD-FE 1-20 MG-MCG(24) PO CAPS: 1.0000 | ORAL_CAPSULE | Freq: Every day | ORAL | 11 refills | Status: DC

## 2017-02-19 NOTE — Progress Notes (Signed)
Andrea Stephenson is a 26 y.o. who presents for a postpartum visit. She is 4 weeks postpartum following a spontaneous vaginal delivery. I have fully reviewed the prenatal and intrapartum course. The delivery was at 40.1 gestational weeks.  Anesthesia: epidural. Postpartum course has been unevnetful. Baby's course has been uneventful. Baby is feeding by bottle. . Bleeding: no bleeding. Bowel function is normal. Bladder function is normal. Patient is sexually active. Contraception method is none. Postpartum depression screening: positive. Denies SI/HI,  Has hx of PPd, wants to restart lexapro  Current Outpatient Medications:  .  acetaminophen (TYLENOL) 500 MG tablet, Take 1,000 mg by mouth every 6 (six) hours as needed for mild pain, moderate pain or headache., Disp: , Rfl:  .  ferrous sulfate 325 (65 FE) MG tablet, Take 1 tablet (325 mg total) by mouth 2 (two) times daily with a meal. (Patient not taking: Reported on 02/19/2017), Disp: 60 tablet, Rfl: 3 .  ibuprofen (ADVIL,MOTRIN) 600 MG tablet, Take 1 tablet (600 mg total) by mouth every 6 (six) hours. (Patient not taking: Reported on 02/19/2017), Disp: 30 tablet, Rfl: 0 .  norethindrone (MICRONOR,CAMILA,ERRIN) 0.35 MG tablet, Take 1 tablet (0.35 mg total) by mouth daily. (Patient not taking: Reported on 02/19/2017), Disp: 1 Package, Rfl: 11 .  Prenatal Vit-Fe Fumarate-FA (MULTIVITAMIN-PRENATAL) 27-0.8 MG TABS tablet, Take 1 tablet by mouth at bedtime., Disp: , Rfl:   Review of Systems   Constitutional: Negative for fever and chills Eyes: Negative for visual disturbances Respiratory: Negative for shortness of breath, dyspnea Cardiovascular: Negative for chest pain or palpitations  Gastrointestinal: Negative for vomiting, diarrhea and constipation Genitourinary: Negative for dysuria and urgency Musculoskeletal: Negative for back pain, joint pain, myalgias  Neurological: Negative for dizziness and headaches    Objective:     Vitals:   02/19/17 1545   BP: 120/80  Pulse: 81   General:  alert, cooperative and no distress   Breasts:  negative  Lungs: clear to auscultation bilaterally  Heart:  regular rate and rhythm  Abdomen: Soft, nontender   Vulva:  normal  Vagina: normal vagina  Cervix:  closed  Corpus: Well involuted     Rectal Exam: no hemorrhoids        Assessment:    normal postpartum exam.  Plan:   1. Contraception: Taytulla per request.   2.  PPD:  Started back on LExapro and referral to [facility] sent 3. Follow up in:   or as needed.

## 2017-04-10 IMAGING — US US OB COMP LESS 14 WK
1 series · 14 of 28 positions shown · non-contrast
Comparison: None.

CLINICAL DATA: 23-year-old pregnant female with pelvic pain.

EXAM:
OBSTETRIC <14 WK US AND TRANSVAGINAL OB US
TECHNIQUE: Both transabdominal and transvaginal ultrasound examinations were
performed for complete evaluation of the gestation as well as the
maternal uterus, adnexal regions, and pelvic cul-de-sac.
Transvaginal technique was performed to assess early pregnancy.

[Series 1: us ob comp less 14 wk · 0.24mm/px · 14 of 102 slices shown]
[im 4/102]
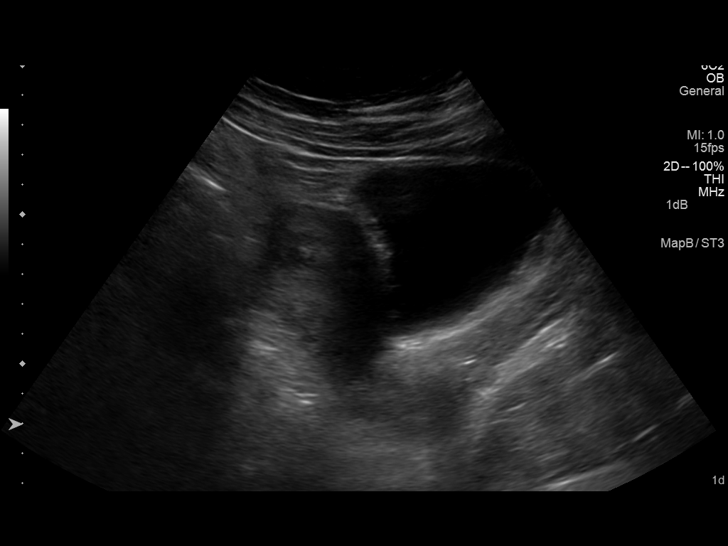
[im 12/102]
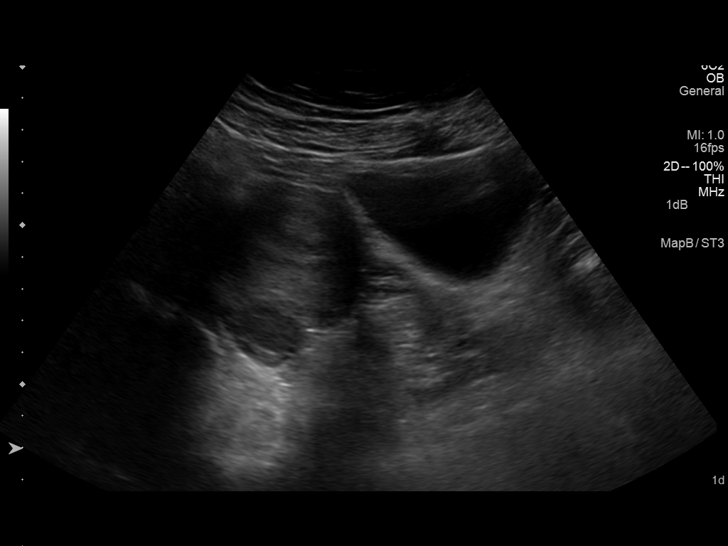
[im 19/102]
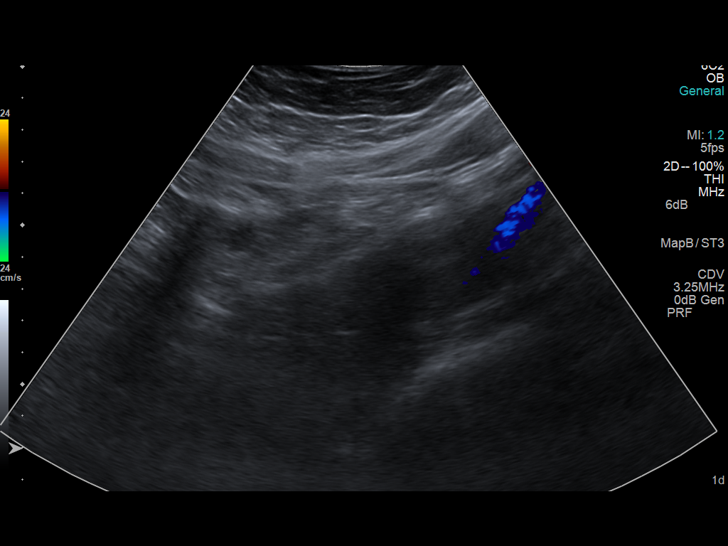
[im 27/102]
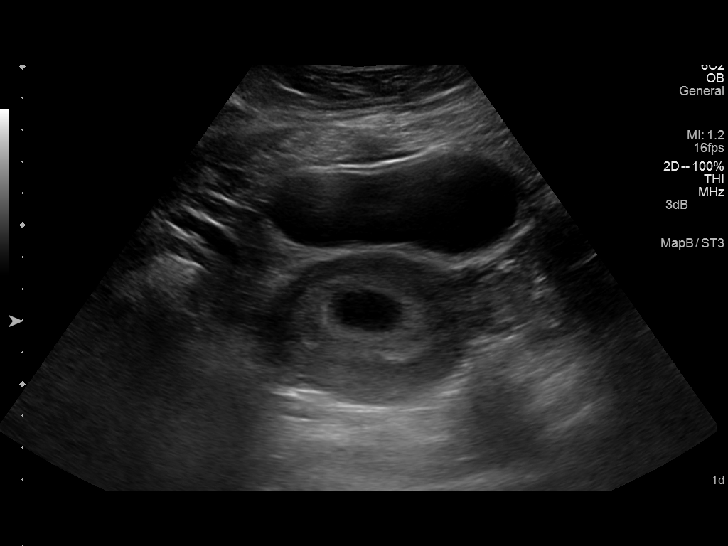
[im 34/102]
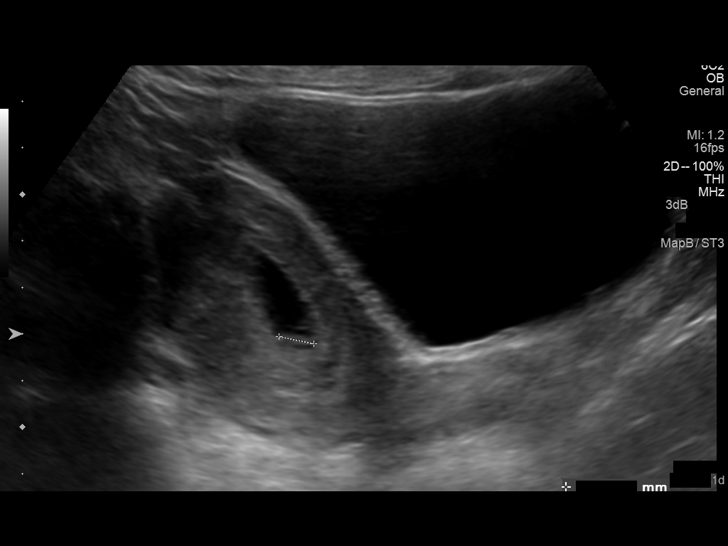
[im 42/102]
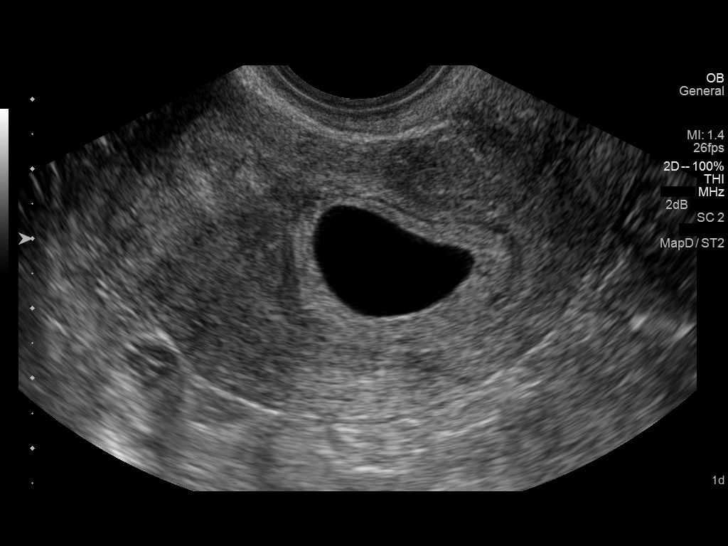
[im 49/102]
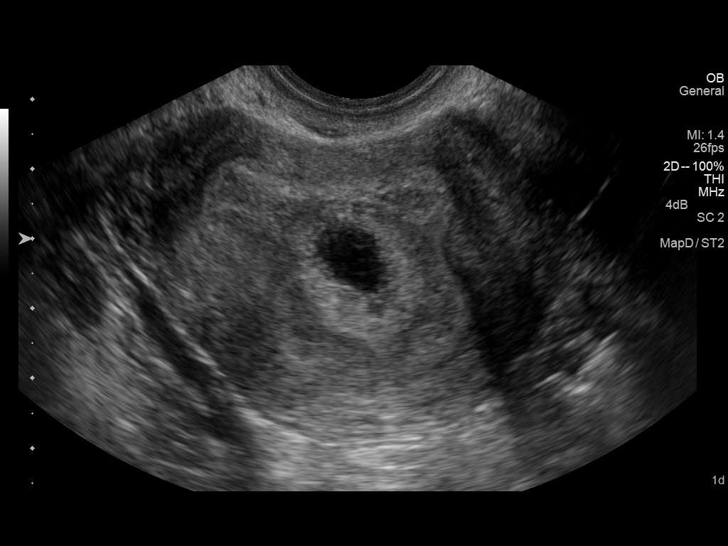
[im 57/102]
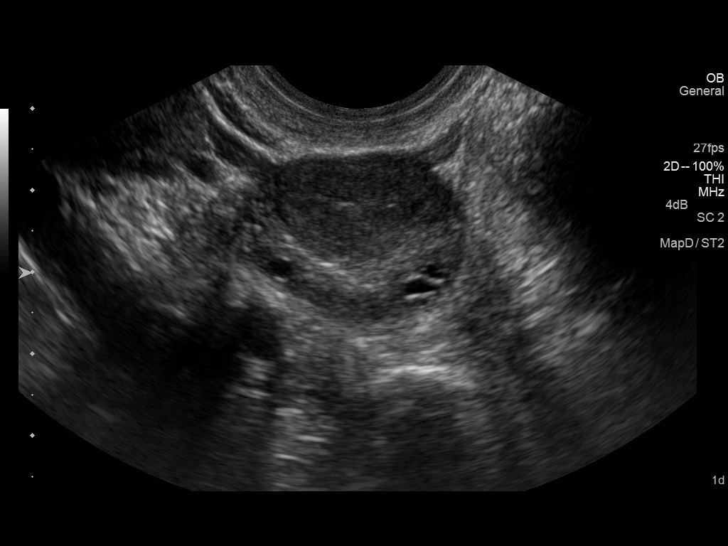
[im 64/102]
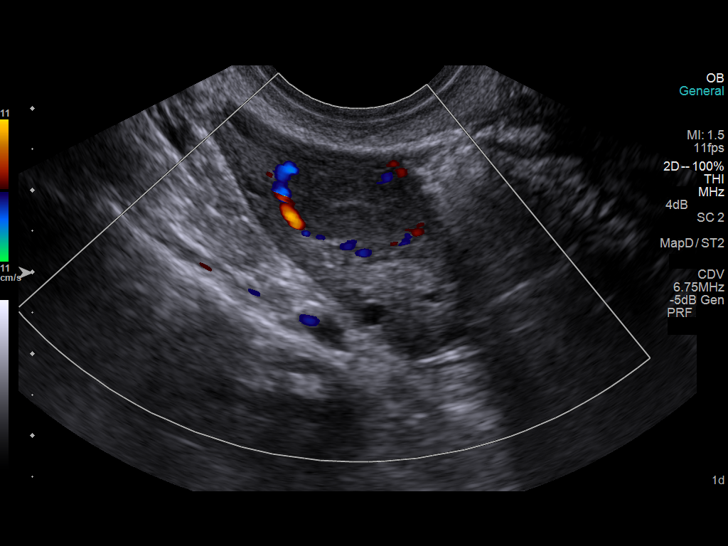
[im 72/102]
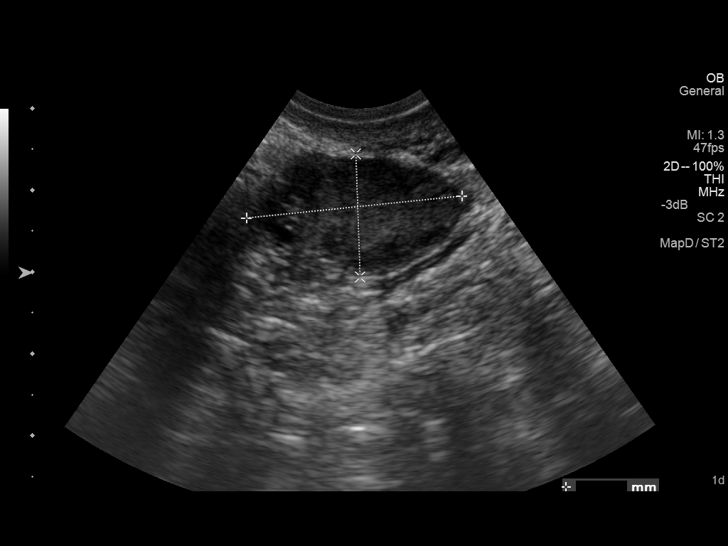
[im 79/102]
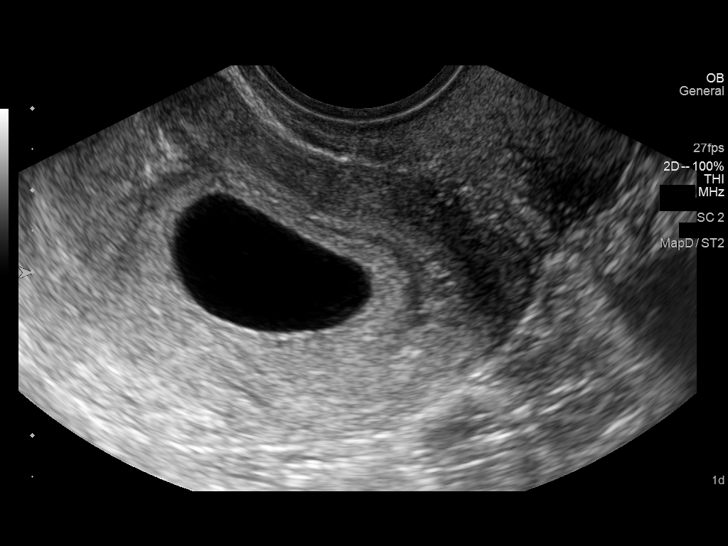
[im 87/102]
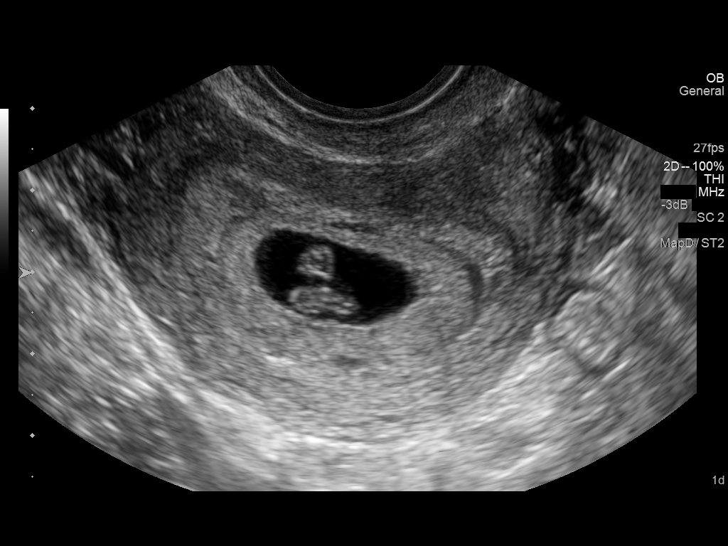
[im 94/102]
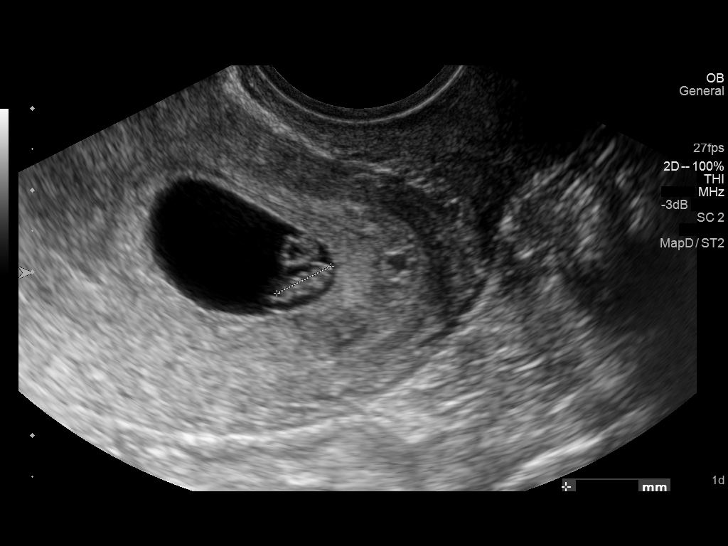
[im 102/102]
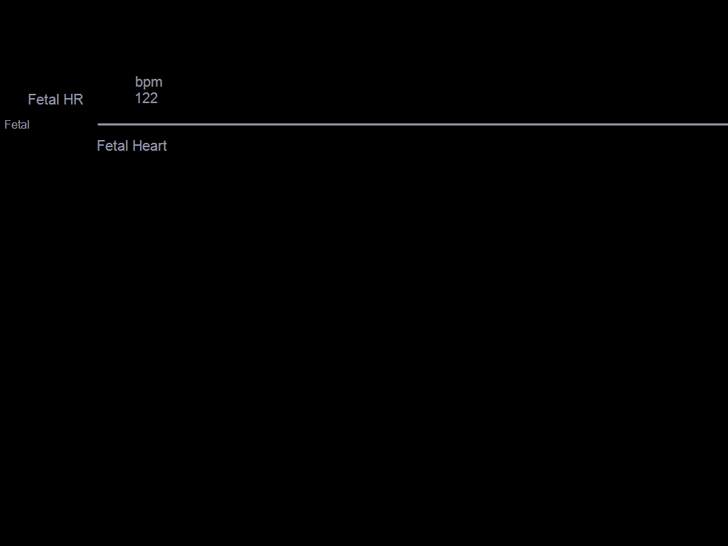

[14 of 28 positions shown; findings below may reference images not displayed]

FINDINGS: Intrauterine gestational sac: Visualized/normal in shape.

Yolk sac:  Visualized

Embryo:  Visualized

Cardiac Activity: Visualized

Heart Rate: 122  bpm

CRL:  8.3  mm   6 w   6 d                  US EDC: 04/09/2015

Maternal uterus/adnexae: A small subchronic hemorrhage is noted.

The ovaries are unremarkable.

No free fluid or adnexal mass.
IMPRESSION: Single living intrauterine pregnancy with estimated gestational age
of 6 weeks 6 days by this ultrasound. Small subchronic hemorrhage.

## 2017-10-05 ENCOUNTER — Ambulatory Visit: Payer: Medicaid Other | Admitting: Adult Health

## 2018-04-21 ENCOUNTER — Ambulatory Visit (INDEPENDENT_AMBULATORY_CARE_PROVIDER_SITE_OTHER): Payer: Medicaid Other | Admitting: Adult Health

## 2018-04-21 ENCOUNTER — Encounter: Payer: Self-pay | Admitting: Adult Health

## 2018-04-21 VITALS — BP 106/69 | HR 83 | Ht 65.0 in | Wt 212.0 lb

## 2018-04-21 DIAGNOSIS — Z3202 Encounter for pregnancy test, result negative: Secondary | ICD-10-CM | POA: Insufficient documentation

## 2018-04-21 DIAGNOSIS — Z30011 Encounter for initial prescription of contraceptive pills: Secondary | ICD-10-CM | POA: Insufficient documentation

## 2018-04-21 LAB — POCT URINE PREGNANCY: Preg Test, Ur: NEGATIVE

## 2018-04-21 MED ORDER — NORETHIN-ETH ESTRAD-FE BIPHAS 1 MG-10 MCG / 10 MCG PO TABS: 1.0000 | ORAL_TABLET | Freq: Every day | ORAL | 11 refills | Status: AC

## 2018-04-21 NOTE — Progress Notes (Signed)
Patient ID: Andrea Stephenson, female   DOB: 1990/08/24, 28 y.o.   MRN: [id number] History of Present Illness: Andrea Stephenson is a 28 year old white female, single, G3P2 in to get on birth control, she wants OCs.  PCP is [facility].   Current Medications, Allergies, Past Medical History, Past Surgical History, Family History and Social History were reviewed in [employer] record.     Review of Systems: Patient denies any headaches, hearing loss, fatigue, blurred vision, shortness of breath, chest pain, abdominal pain, problems with bowel movements, urination, or intercourse. No joint pain or mood swings. She does smoke about 1/2 ppd of cigarettes.  No personnel history of stroke, DVT or breast cancer.    Physical Exam:BP 106/69 (BP Location: Left Arm, Patient Position: Sitting, Cuff Size: Large)   Pulse 83   Ht 5\' 5"  (1.651 m)   Wt 212 lb (96.2 kg)   LMP 04/09/2018 (Approximate)   Breastfeeding No   BMI 35.28 kg/m   UPT is negative. General:  Well developed, well nourished, no acute distress Skin:  Warm and dry Neck:  Midline trachea, normal thyroid, good ROM, no lymphadenopathy Lungs; Clear to auscultation bilaterally Cardiovascular: Regular rate and rhyth Psych:  No mood changes, alert and cooperative,seems happy Fall risk is low. Will Rx lo loestrin, can start today 1 pack given, use condoms.  Impression: 1. Encounter for initial prescription of contraceptive pills   2. Pregnancy examination or test, negative result       Plan:  Meds ordered this encounter  Medications  . Norethindrone-Ethinyl Estradiol-Fe Biphas (LO LOESTRIN FE) 1 MG-10 MCG / 10 MCG tablet    Sig: Take 1 tablet by mouth daily. Take 1 daily by mouth    Dispense:  1 Package    Refill:  11    BIN [id number], PCN [id number], GRP [id number]    Order Specific Question:   Supervising Provider    Answer:   Lazaro Arms [[id number]]  Return in 3 months for pap and physical and ROS on pills

## 2018-07-22 ENCOUNTER — Other Ambulatory Visit: Payer: Self-pay | Admitting: Adult Health

## 2018-09-15 ENCOUNTER — Telehealth: Payer: Self-pay | Admitting: *Deleted

## 2018-09-15 NOTE — Telephone Encounter (Signed)
Message with restrictions.

## 2018-09-16 ENCOUNTER — Other Ambulatory Visit: Payer: Self-pay

## 2018-09-16 ENCOUNTER — Other Ambulatory Visit: Payer: Medicaid Other | Admitting: Adult Health

## 2019-04-04 ENCOUNTER — Other Ambulatory Visit: Payer: Self-pay

## 2019-04-04 ENCOUNTER — Ambulatory Visit: Payer: Medicaid Other | Attending: Internal Medicine

## 2019-04-04 DIAGNOSIS — Z20822 Contact with and (suspected) exposure to covid-19: Secondary | ICD-10-CM

## 2019-04-06 LAB — NOVEL CORONAVIRUS, NAA: SARS-CoV-2, NAA: NOT DETECTED

## 2020-06-24 ENCOUNTER — Encounter (HOSPITAL_COMMUNITY): Payer: Self-pay | Admitting: Emergency Medicine

## 2020-06-24 ENCOUNTER — Other Ambulatory Visit: Payer: Self-pay

## 2020-06-24 ENCOUNTER — Emergency Department (HOSPITAL_COMMUNITY)
Admission: EM | Admit: 2020-06-24 | Discharge: 2020-06-24 | Disposition: A | Discharge status: Home / Self Care | Payer: Medicaid Other | Attending: Emergency Medicine | Admitting: Emergency Medicine

## 2020-06-24 DIAGNOSIS — Z91040 Latex allergy status: Secondary | ICD-10-CM | POA: Insufficient documentation

## 2020-06-24 DIAGNOSIS — T7840XA Allergy, unspecified, initial encounter: Secondary | ICD-10-CM | POA: Insufficient documentation

## 2020-06-24 DIAGNOSIS — F17210 Nicotine dependence, cigarettes, uncomplicated: Secondary | ICD-10-CM | POA: Diagnosis not present

## 2020-06-24 DIAGNOSIS — R21 Rash and other nonspecific skin eruption: Secondary | ICD-10-CM | POA: Diagnosis present

## 2020-06-24 DIAGNOSIS — X58XXXA Exposure to other specified factors, initial encounter: Secondary | ICD-10-CM | POA: Diagnosis not present

## 2020-06-24 DIAGNOSIS — L509 Urticaria, unspecified: Secondary | ICD-10-CM

## 2020-06-24 HISTORY — DX: Depression, unspecified: F32.A

## 2020-06-24 MED ORDER — FAMOTIDINE 20 MG PO TABS
20.0000 mg | ORAL_TABLET | Freq: Once | ORAL | Status: AC
  Administered 2020-06-24: 20 mg via ORAL
  Filled 2020-06-24: qty 1

## 2020-06-24 MED ORDER — PREDNISONE 10 MG (21) PO TBPK: ORAL_TABLET | Freq: Every day | ORAL | 0 refills | Status: AC

## 2020-06-24 MED ORDER — PREDNISONE 20 MG PO TABS
60.0000 mg | ORAL_TABLET | Freq: Once | ORAL | Status: AC
  Administered 2020-06-24: 60 mg via ORAL
  Filled 2020-06-24: qty 3

## 2020-06-24 NOTE — Discharge Instructions (Addendum)
You came to the emergency department today to be evaluated for your rash.  Your rash is consistent with an allergic reaction.  Due to this I have started you on a steroid taper.  Please take this medication as prescribed.  Please also take Benadryl at night.  Please follow-up with your primary care provider, urgent care, or the emergency department in the next 2 to 3 days for reevaluation of your rash.  Please return to the emergency department if you develop any shortness of breath, difficulty breathing or worsening of your symptoms.  Return to the emergency department if:  You have a fever. You have pain in your abdomen. Your tongue or lips are swollen. Your eyelids are swollen. Your chest or throat feels tight. You have trouble breathing or swallowing.

## 2020-06-24 NOTE — ED Triage Notes (Signed)
C/o itching and hives x 2 days.  States it started at scalp and has gradually spread.  States back of head is swollen and hard.  Started using new shampoo  approx 2 weeks ago.  Took Benadryl this morning and took a nap.  States it was worse when she got up from nap.

## 2020-06-24 NOTE — ED Provider Notes (Signed)
[facility] EMERGENCY DEPARTMENT Provider Note   CSN: [id number] Arrival date & time: 06/24/20  1843     History Chief Complaint  Patient presents with  . Allergic Reaction    Andrea Stephenson is a 30 y.o. female past medical history of anxiety, depression, ADHD.  Presents with a chief complaint of rash.  Patient reports that her rash began 2 days prior.  Patient states that the rash started on her scalp and has progressively worsened.  Patient states that today she noticed rash to lateral hip, bilateral flexor surface of elbow, bilateral flexor surface of the knee, and bilateral forearms.  Patient reports that her rash is pruritic.  Patient denies any alleviating or aggravating factors.  Patient reports that she started using a new shampoo called "Maui shampoo."  Patient reports that 3 weeks prior she was discontinued from her Lexapro Acacian and started on Adderall and Wellbutrin.  Patient reports that 3 days prior she was stopped on her Wellbutrin and darted back on Lexapro.  Denies any other changes to medications, new foods, new soaps, detergents, or lotions.  Patient denies any recent camping, hiking, or outdoor work.  Patient denies any new pets or contact with animals.  Patient denies any fevers, chills, unexpected weight loss, night sweats, persistent fatigue, difficulty swallowing, drooling, shortness of breath, difficulty breathing, chest tightness, wounds, swelling of her lips, tongue, or eyelids, pruritus is not exacerbated from showering.  HPI     Past Medical History:  Diagnosis Date  . Anxiety   . Depression   . History of ADHD 10/06/2012    Patient Active Problem List   Diagnosis Date Noted  . Encounter for initial prescription of contraceptive pills 04/21/2018  . Pregnancy examination or test, negative result 04/21/2018  . Pregnancy 01/16/2017  . Rh negative state in antepartum period 07/23/2016  . Anxiety 06/25/2016  . Postpartum depression  06/20/2015  . History of ADHD 10/06/2012    Past Surgical History:  Procedure Laterality Date  . NO PAST SURGERIES       OB History    Gravida  3   Para  2   Term  2   Preterm      AB  1   Living  2     SAB      IAB      Ectopic      Multiple  0   Live Births  2           Family History  Problem Relation Age of Onset  . Diabetes Mother   . Hypertension Mother   . Hypertension Father   . Cerebral palsy Sister   . Cancer Maternal Grandmother        lung ca  . Diabetes Maternal Grandmother   . Hypertension Maternal Grandmother   . Heart attack Maternal Grandfather   . Heart attack Paternal Grandmother   . Heart disease Paternal Grandmother   . Heart attack Paternal Grandfather   . Heart disease Paternal Grandfather     Social History   Tobacco Use  . Smoking status: Current Every Day Smoker    Packs/day: 0.50    Years: 10.00    Pack years: 5.00    Types: Cigarettes    Last attempt to quit: 05/06/2016    Years since quitting: 4.1  . Smokeless tobacco: Never Used  Vaping Use  . Vaping Use: Never used  Substance Use Topics  . Alcohol use: Yes    Alcohol/week: 0.0  standard drinks  . Drug use: No    Home Medications Prior to Admission medications   Medication Sig Start Date End Date Taking? Authorizing Provider  acetaminophen (TYLENOL) 500 MG tablet Take 1,000 mg by mouth every 6 (six) hours as needed for mild pain, moderate pain or headache.    [provider]  Norethindrone-Ethinyl Estradiol-Fe Biphas (LO LOESTRIN FE) 1 MG-10 MCG / 10 MCG tablet Take 1 tablet by mouth daily. Take 1 daily by mouth 04/21/18   Adline Potter, NP    Allergies    Food and Latex  Review of Systems   Review of Systems  Constitutional: Negative for chills, fatigue, fever and unexpected weight change.  HENT: Negative for drooling, trouble swallowing and voice change.   Eyes: Negative for pain, discharge, redness, itching and visual disturbance.   Respiratory: Negative for chest tightness and shortness of breath.   Cardiovascular: Negative for chest pain.  Gastrointestinal: Negative for nausea and vomiting.  Skin: Positive for rash. Negative for pallor and wound.  Psychiatric/Behavioral: Negative for confusion.    Physical Exam Updated Vital Signs BP 140/84   Pulse 76   Temp 99.2 F (37.3 C)   Resp 16   LMP 06/17/2020   SpO2 99%   Physical Exam Vitals and nursing note reviewed.  Constitutional:      General: She is not in acute distress.    Appearance: She is not ill-appearing, toxic-appearing or diaphoretic.  HENT:     Head: Normocephalic. No raccoon eyes, Battle's sign, abrasion, contusion, masses, right periorbital erythema, left periorbital erythema or laceration.     Jaw: No trismus or pain on movement.     Comments: No lice or nits were noted to patient's hair    Mouth/Throat:     Lips: Pink. No lesions.     Mouth: Mucous membranes are moist. No angioedema.     Tongue: No lesions. Tongue does not deviate from midline.     Palate: No mass and lesions.     Pharynx: Oropharynx is clear. Uvula midline. No pharyngeal swelling, oropharyngeal exudate, posterior oropharyngeal erythema or uvula swelling.  Eyes:     General: No scleral icterus.       Right eye: No discharge.        Left eye: No discharge.  Cardiovascular:     Rate and Rhythm: Normal rate.  Pulmonary:     Effort: Pulmonary effort is normal. No tachypnea, bradypnea or respiratory distress.     Breath sounds: Normal breath sounds. No stridor.     Comments: Able to speak in full complete sentences without difficulty Musculoskeletal:     Cervical back: Normal range of motion and neck supple.  Skin:    General: Skin is warm and dry.     Coloration: Skin is not jaundiced or pale.     Findings: Rash present. No petechiae. Rash is urticarial. Rash is not crusting, macular, nodular, papular, purpuric, pustular, scaling or vesicular.     Comments: Urticarial  rash noted to bilateral hips, flexor surface of bilateral knees, flexor surface of bilateral elbows, and bilateral forearms; Rash is blanchable  Confluence of erythema noted to patient's scalp; no crusting, scaling, vesicles, ecchymosis, wound  No purpura, petechia  No burrows or wounds noted to web spacing of patient's fingers  Neurological:     General: No focal deficit present.     Mental Status: She is alert.     GCS: GCS eye subscore is 4. GCS verbal subscore is 5.  GCS motor subscore is 6.  Psychiatric:        Behavior: Behavior is cooperative.     ED Results / Procedures / Treatments   Labs (all labs ordered are listed, but only abnormal results are displayed) Labs Reviewed - No data to display  EKG None  Radiology No results found.  Procedures Procedures  Medications Ordered in ED Medications  predniSONE (DELTASONE) tablet 60 mg (60 mg Oral Given 06/24/20 2156)  famotidine (PEPCID) tablet 20 mg (20 mg Oral Given 06/24/20 2155)    ED Course  I have reviewed the triage vital signs and the nursing notes.  Pertinent labs & imaging results that were available during my care of the patient were reviewed by me and considered in my medical decision making (see chart for details).    MDM Rules/Calculators/A&P                          Alert 30 year old female no acute distress, nontoxic-appearing.  Patient presents with chief complaint of rash.  Patient reports rash has been present for the last 2 days.  Rash started on her scalp and then today noticed spreading to her arms, legs, and hips.  Patient reports using a new shampoo.  Urticarial rash noted to bilateral hips, flexor surface of bilateral knees, flexor surface of bilateral elbows, and bilateral forearms; Rash is blanchable.  Confluence of erythema noted to patient's scalp; no crusting, scaling, vesicles, ecchymosis, wound.  No purpura or petechia observed.  No burrows or wounds noted to web spacing of patient's  fingers.  No lice, nits, or eggs noted to patient's hair.  Lungs clear to auscultation bilaterally.  Patient able to speak in full complete sentences without difficulty.  Patient has no difficulty managing her oral secretions.  No angioedema noted.  No concern for anaphylaxis at this time.  Concern for allergic reaction.  Will start patient on oral course of prednisone.  Will give patient prednisone taper.  Patient also advised to use Benadryl nightly while at home.  Patient instructed to see her primary care provider, in urgent care or return to the emergency department in 2 to 3 days for reevaluation of her rash.  Patient was given strict return precautions.  Patient expressed understanding of all instructions.   Final Clinical Impression(s) / ED Diagnoses Final diagnoses:  Allergic reaction, initial encounter  Urticaria    Rx / DC Orders ED Discharge Orders         Ordered    predniSONE (STERAPRED UNI-PAK 21 TAB) 10 MG (21) TBPK tablet  Daily        06/24/20 2140           Berneice Heinrich 06/24/20 2344    Lorre Nick, MD 06/26/20 1031
